# Patient Record
Sex: Male | Born: 1970 | Race: Black or African American | Hispanic: No | Marital: Single | Smoking: Current every day smoker
Health system: Southern US, Community
[De-identification: ages and names within clinical notes are randomized; demographics above are authoritative.]

## PROBLEM LIST (undated history)

## (undated) DIAGNOSIS — J3089 Other allergic rhinitis: Secondary | ICD-10-CM

## (undated) DIAGNOSIS — I10 Essential (primary) hypertension: Secondary | ICD-10-CM

## (undated) HISTORY — PX: LEG SURGERY: SHX1003

---

## 2003-11-26 ENCOUNTER — Emergency Department (HOSPITAL_COMMUNITY): Admission: EM | Admit: 2003-11-26 | Discharge: 2003-11-26 | Payer: Self-pay | Admitting: Family Medicine

## 2003-12-10 ENCOUNTER — Emergency Department (HOSPITAL_COMMUNITY): Admission: EM | Admit: 2003-12-10 | Discharge: 2003-12-10 | Payer: Self-pay | Admitting: Family Medicine

## 2004-09-08 ENCOUNTER — Emergency Department (HOSPITAL_COMMUNITY): Admission: EM | Admit: 2004-09-08 | Discharge: 2004-09-08 | Payer: Self-pay | Admitting: Family Medicine

## 2004-09-12 ENCOUNTER — Emergency Department (HOSPITAL_COMMUNITY): Admission: EM | Admit: 2004-09-12 | Discharge: 2004-09-12 | Payer: Self-pay | Admitting: Family Medicine

## 2005-03-31 ENCOUNTER — Emergency Department (HOSPITAL_COMMUNITY): Admission: EM | Admit: 2005-03-31 | Discharge: 2005-03-31 | Payer: Self-pay | Admitting: Family Medicine

## 2005-04-06 ENCOUNTER — Emergency Department (HOSPITAL_COMMUNITY): Admission: EM | Admit: 2005-04-06 | Discharge: 2005-04-06 | Payer: Self-pay | Admitting: Family Medicine

## 2005-07-07 ENCOUNTER — Emergency Department (HOSPITAL_COMMUNITY): Admission: EM | Admit: 2005-07-07 | Discharge: 2005-07-07 | Payer: Self-pay | Admitting: Family Medicine

## 2007-03-12 ENCOUNTER — Emergency Department (HOSPITAL_COMMUNITY): Admission: EM | Admit: 2007-03-12 | Discharge: 2007-03-12 | Payer: Self-pay | Admitting: Family Medicine

## 2010-04-29 ENCOUNTER — Emergency Department (HOSPITAL_COMMUNITY): Admission: EM | Admit: 2010-04-29 | Discharge: 2010-04-29 | Payer: Self-pay | Admitting: Family Medicine

## 2010-08-17 ENCOUNTER — Encounter: Payer: Self-pay | Admitting: Family Medicine

## 2011-05-08 LAB — POCT URINALYSIS DIP (DEVICE)
Operator id: 282151
Protein, ur: 30 — AB
Urobilinogen, UA: 0.2

## 2011-05-08 LAB — GC/CHLAMYDIA PROBE AMP, GENITAL: GC Probe Amp, Genital: NEGATIVE

## 2012-10-26 ENCOUNTER — Emergency Department (HOSPITAL_COMMUNITY)
Admission: EM | Admit: 2012-10-26 | Discharge: 2012-10-26 | Disposition: A | Discharge status: Home / Self Care | Payer: Self-pay | Attending: Emergency Medicine | Admitting: Emergency Medicine

## 2012-10-26 ENCOUNTER — Encounter (HOSPITAL_COMMUNITY): Payer: Self-pay | Admitting: *Deleted

## 2012-10-26 ENCOUNTER — Emergency Department (HOSPITAL_COMMUNITY): Payer: Self-pay

## 2012-10-26 DIAGNOSIS — Y9389 Activity, other specified: Secondary | ICD-10-CM | POA: Insufficient documentation

## 2012-10-26 DIAGNOSIS — S39012A Strain of muscle, fascia and tendon of lower back, initial encounter: Secondary | ICD-10-CM

## 2012-10-26 DIAGNOSIS — IMO0001 Reserved for inherently not codable concepts without codable children: Secondary | ICD-10-CM | POA: Insufficient documentation

## 2012-10-26 DIAGNOSIS — X500XXA Overexertion from strenuous movement or load, initial encounter: Secondary | ICD-10-CM | POA: Insufficient documentation

## 2012-10-26 DIAGNOSIS — Y929 Unspecified place or not applicable: Secondary | ICD-10-CM | POA: Insufficient documentation

## 2012-10-26 DIAGNOSIS — M62838 Other muscle spasm: Secondary | ICD-10-CM | POA: Insufficient documentation

## 2012-10-26 DIAGNOSIS — R062 Wheezing: Secondary | ICD-10-CM

## 2012-10-26 DIAGNOSIS — F1210 Cannabis abuse, uncomplicated: Secondary | ICD-10-CM | POA: Insufficient documentation

## 2012-10-26 DIAGNOSIS — G8929 Other chronic pain: Secondary | ICD-10-CM | POA: Insufficient documentation

## 2012-10-26 DIAGNOSIS — M545 Low back pain, unspecified: Secondary | ICD-10-CM

## 2012-10-26 DIAGNOSIS — R071 Chest pain on breathing: Secondary | ICD-10-CM | POA: Insufficient documentation

## 2012-10-26 DIAGNOSIS — Z79899 Other long term (current) drug therapy: Secondary | ICD-10-CM | POA: Insufficient documentation

## 2012-10-26 DIAGNOSIS — M62830 Muscle spasm of back: Secondary | ICD-10-CM

## 2012-10-26 DIAGNOSIS — S335XXA Sprain of ligaments of lumbar spine, initial encounter: Secondary | ICD-10-CM | POA: Insufficient documentation

## 2012-10-26 MED ORDER — METHOCARBAMOL 750 MG PO TABS: 750.0000 mg | ORAL_TABLET | Freq: Four times a day (QID) | ORAL | Status: DC | PRN

## 2012-10-26 MED ORDER — DIAZEPAM 5 MG PO TABS
10.0000 mg | ORAL_TABLET | Freq: Once | ORAL | Status: AC
  Administered 2012-10-26: 5 mg via ORAL
  Filled 2012-10-26: qty 2

## 2012-10-26 MED ORDER — NAPROXEN 500 MG PO TABS: 500.0000 mg | ORAL_TABLET | Freq: Two times a day (BID) | ORAL | Status: DC | PRN

## 2012-10-26 MED ORDER — ALBUTEROL SULFATE (5 MG/ML) 0.5% IN NEBU
5.0000 mg | INHALATION_SOLUTION | Freq: Once | RESPIRATORY_TRACT | Status: AC
  Administered 2012-10-26: 5 mg via RESPIRATORY_TRACT
  Filled 2012-10-26: qty 1

## 2012-10-26 MED ORDER — ALBUTEROL SULFATE HFA 108 (90 BASE) MCG/ACT IN AERS: 2.0000 | INHALATION_SPRAY | RESPIRATORY_TRACT | Status: DC | PRN

## 2012-10-26 MED ORDER — HYDROCODONE-ACETAMINOPHEN 5-325 MG PO TABS
2.0000 | ORAL_TABLET | Freq: Once | ORAL | Status: AC
  Administered 2012-10-26: 1 via ORAL
  Filled 2012-10-26: qty 2

## 2012-10-26 MED ORDER — HYDROCODONE-ACETAMINOPHEN 5-325 MG PO TABS: 1.0000 | ORAL_TABLET | Freq: Four times a day (QID) | ORAL | Status: DC | PRN

## 2012-10-26 NOTE — ED Notes (Signed)
Reports severe lower back pain, hx of same, denies new injury to back. Ambulatory at triage.

## 2012-10-26 NOTE — ED Provider Notes (Signed)
Medical screening examination/treatment/procedure(s) were performed by non-physician practitioner and as supervising physician I was immediately available for consultation/collaboration.   Loren Racer, MD 10/26/12 [phone]
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
## 2013-02-27 ENCOUNTER — Encounter (HOSPITAL_COMMUNITY): Payer: Self-pay

## 2013-02-27 ENCOUNTER — Emergency Department (HOSPITAL_COMMUNITY)
Admission: EM | Admit: 2013-02-27 | Discharge: 2013-02-28 | Disposition: A | Discharge status: Home / Self Care | Payer: BC Managed Care – PPO | Attending: Emergency Medicine | Admitting: Emergency Medicine

## 2013-02-27 DIAGNOSIS — M545 Low back pain, unspecified: Secondary | ICD-10-CM | POA: Insufficient documentation

## 2013-02-27 DIAGNOSIS — R109 Unspecified abdominal pain: Secondary | ICD-10-CM | POA: Insufficient documentation

## 2013-02-27 MED ORDER — ONDANSETRON HCL 4 MG/2ML IJ SOLN
4.0000 mg | Freq: Once | INTRAMUSCULAR | Status: AC
  Administered 2013-02-27: 4 mg via INTRAVENOUS
  Filled 2013-02-27: qty 2

## 2013-02-27 MED ORDER — SODIUM CHLORIDE 0.9 % IV SOLN
1000.0000 mL | Freq: Once | INTRAVENOUS | Status: AC
  Administered 2013-02-27: 1000 mL via INTRAVENOUS

## 2013-02-27 MED ORDER — HYDROMORPHONE HCL PF 1 MG/ML IJ SOLN
0.5000 mg | INTRAMUSCULAR | Status: DC | PRN
  Filled 2013-02-27: qty 1

## 2013-02-27 MED ORDER — SODIUM CHLORIDE 0.9 % IV SOLN: 1000.0000 mL | INTRAVENOUS | Status: DC

## 2013-02-27 NOTE — ED Notes (Signed)
Pt c/o constipation and Right side back pain x2 days. Pt reports the pain was relieved after having a BM but the pain returned today. Pt states he has taken Maalox and 2 blue pills his aunt gave him, pt is unsure of the name of the medication.

## 2013-02-27 NOTE — ED Provider Notes (Signed)
CSN: [id number]     Arrival date & time 02/27/13  1922 History     First MD Initiated Contact with Patient 02/27/13 2324     Chief Complaint  Patient presents with  . Constipation  . Back Pain   HPI Patient presents to emergency room with complaints of constipation as well as lower abdominal pain as well as back pain. Symptoms started about 2 days ago. Patient tried taking some laxative medications resulting in a bowel movement. He felt like his symptoms returned yesterday and tried taking laxative medications without any result. The patient denies any nausea or vomiting. He denies any fevers or chills. The pain is in the bilateral lower back as well as the right lower abdomen. He denies any dysuria or hematuria. History reviewed. No pertinent past medical history. Past Surgical History  Procedure Laterality Date  . Leg surgery     History reviewed. No pertinent family history. History  Substance Use Topics  . Smoking status: Not on file  . Smokeless tobacco: Not on file  . Alcohol Use: No    Review of Systems  All other systems reviewed and are negative.    Allergies  Review of patient's allergies indicates no known allergies.  Home Medications   Current Outpatient Rx  Name  Route  Sig  Dispense  Refill  . magnesium citrate SOLN   Oral   Take 296 mLs (1 Bottle total) by mouth once.   195 mL   1    BP 126/78  Pulse 58  Temp(Src) 98.1 F (36.7 C) (Oral)  Resp 20  Ht 5\' 8"  (1.727 m)  Wt 236 lb (107.049 kg)  BMI 35.89 kg/m2  SpO2 100% Physical Exam  Nursing note and vitals reviewed. Constitutional: He appears well-developed and well-nourished. No distress.  HENT:  Head: Normocephalic and atraumatic.  Right Ear: External ear normal.  Left Ear: External ear normal.  Eyes: Conjunctivae are normal. Right eye exhibits no discharge. Left eye exhibits no discharge. No scleral icterus.  Neck: Neck supple. No tracheal deviation present.  Cardiovascular: Normal rate,  regular rhythm and intact distal pulses.   Pulmonary/Chest: Effort normal and breath sounds normal. No stridor. No respiratory distress. He has no wheezes. He has no rales.  Abdominal: Soft. Bowel sounds are normal. He exhibits no distension. There is tenderness in the right lower quadrant. There is no rebound and no guarding. No hernia.  Genitourinary: Rectum normal.  No fecal impaction  Musculoskeletal: He exhibits no edema and no tenderness.  Neurological: He is alert. He has normal strength. No sensory deficit. Cranial nerve deficit:  no gross defecits noted. He exhibits normal muscle tone. He displays no seizure activity. Coordination normal.  Skin: Skin is warm and dry. No rash noted.  Psychiatric: He has a normal mood and affect.    ED Course   Procedures (including critical care time)  Labs Reviewed  COMPREHENSIVE METABOLIC PANEL - Abnormal; Notable for the following:    Total Bilirubin 0.2 (*)    GFR calc non Af Amer 89 (*)    All other components within normal limits  CBC WITH DIFFERENTIAL - Abnormal; Notable for the following:    WBC 12.5 (*)    Lymphs Abs 4.8 (*)    All other components within normal limits  LIPASE, BLOOD  URINALYSIS, ROUTINE W REFLEX MICROSCOPIC   Dg Abd Acute W/chest  02/28/2013   *RADIOLOGY REPORT*  Clinical Data: Constipation Low back pain.  Vomiting.  ACUTE ABDOMEN SERIES (ABDOMEN  2 VIEW & CHEST 1 VIEW)  Comparison: PA and lateral chest 10/26/2012.  Findings: Single view of the chest demonstrates clear lungs and normal heart size.  No pneumothorax or pleural effusion.  Two views of the abdomen show a normal bowel gas pattern.  No free intraperitoneal air is identified.  No abnormal abdominal calcification is seen.  IMPRESSION: Negative exam.   Original Report Authenticated By: Holley Dexter, M.D.   1. Abdominal pain     MDM  Discussed the findings with the patient. He does have an elevated white blood cell count. She does not show any significant  constipation. Explained the patient was concerned about the possibility of appendicitis with his symptoms. I discussed her CT scan of the abdomen rule out that cause.  Patient decided that he did not want that done. He does not think he has appendicitis. Patient would like to try laxative and understands he should return if the symptoms do not improve.  Celene Kras, MD 02/28/13 [phone]

## 2013-02-28 ENCOUNTER — Emergency Department (HOSPITAL_COMMUNITY): Payer: BC Managed Care – PPO

## 2013-02-28 LAB — CBC WITH DIFFERENTIAL/PLATELET
Basophils Relative: 0 % (ref 0–1)
Hemoglobin: 14.7 g/dL (ref 13.0–17.0)
Lymphs Abs: 4.8 10*3/uL — ABNORMAL HIGH (ref 0.7–4.0)
MCHC: 35.7 g/dL (ref 30.0–36.0)
Monocytes Relative: 7 % (ref 3–12)
Neutro Abs: 6.5 10*3/uL (ref 1.7–7.7)
Neutrophils Relative %: 52 % (ref 43–77)
RBC: 4.6 MIL/uL (ref 4.22–5.81)

## 2013-02-28 LAB — COMPREHENSIVE METABOLIC PANEL
Albumin: 4 g/dL (ref 3.5–5.2)
BUN: 9 mg/dL (ref 6–23)
Creatinine, Ser: 1.03 mg/dL (ref 0.50–1.35)
Potassium: 3.7 mEq/L (ref 3.5–5.1)
Total Protein: 7.4 g/dL (ref 6.0–8.3)

## 2013-02-28 LAB — URINALYSIS, ROUTINE W REFLEX MICROSCOPIC
Leukocytes, UA: NEGATIVE
Nitrite: NEGATIVE
Specific Gravity, Urine: 1.014 (ref 1.005–1.030)
pH: 5.5 (ref 5.0–8.0)

## 2013-02-28 LAB — LIPASE, BLOOD: Lipase: 41 U/L (ref 11–59)

## 2013-02-28 MED ORDER — MAGNESIUM CITRATE PO SOLN: 1.0000 | Freq: Once | ORAL | Status: DC

## 2013-02-28 MED ORDER — ACETAMINOPHEN 325 MG PO TABS
650.0000 mg | ORAL_TABLET | Freq: Once | ORAL | Status: AC
  Administered 2013-02-28: 650 mg via ORAL
  Filled 2013-02-28: qty 2

## 2013-02-28 NOTE — ED Notes (Signed)
Patient transported to X-ray 

## 2014-10-26 ENCOUNTER — Encounter (HOSPITAL_COMMUNITY): Payer: Self-pay

## 2014-10-26 ENCOUNTER — Emergency Department (HOSPITAL_COMMUNITY)
Admission: EM | Admit: 2014-10-26 | Discharge: 2014-10-26 | Disposition: A | Discharge status: Home / Self Care | Payer: Self-pay | Attending: Emergency Medicine | Admitting: Emergency Medicine

## 2014-10-26 DIAGNOSIS — R197 Diarrhea, unspecified: Secondary | ICD-10-CM | POA: Insufficient documentation

## 2014-10-26 DIAGNOSIS — R42 Dizziness and giddiness: Secondary | ICD-10-CM | POA: Insufficient documentation

## 2014-10-26 DIAGNOSIS — R112 Nausea with vomiting, unspecified: Secondary | ICD-10-CM | POA: Insufficient documentation

## 2014-10-26 LAB — CBC WITH DIFFERENTIAL/PLATELET
Basophils Absolute: 0 10*3/uL (ref 0.0–0.1)
Basophils Relative: 0 % (ref 0–1)
Eosinophils Absolute: 0.1 10*3/uL (ref 0.0–0.7)
Eosinophils Relative: 1 % (ref 0–5)
HEMATOCRIT: 42.8 % (ref 39.0–52.0)
HEMOGLOBIN: 14.8 g/dL (ref 13.0–17.0)
LYMPHS ABS: 2.9 10*3/uL (ref 0.7–4.0)
Lymphocytes Relative: 24 % (ref 12–46)
MCH: 31.2 pg (ref 26.0–34.0)
MCHC: 34.6 g/dL (ref 30.0–36.0)
MCV: 90.3 fL (ref 78.0–100.0)
MONOS PCT: 7 % (ref 3–12)
Monocytes Absolute: 0.8 10*3/uL (ref 0.1–1.0)
NEUTROS ABS: 8.2 10*3/uL — AB (ref 1.7–7.7)
NEUTROS PCT: 68 % (ref 43–77)
Platelets: 346 10*3/uL (ref 150–400)
RBC: 4.74 MIL/uL (ref 4.22–5.81)
RDW: 12.8 % (ref 11.5–15.5)
WBC: 12 10*3/uL — ABNORMAL HIGH (ref 4.0–10.5)

## 2014-10-26 LAB — COMPREHENSIVE METABOLIC PANEL WITH GFR
ALT: 25 U/L (ref 0–53)
AST: 22 U/L (ref 0–37)
Albumin: 4.1 g/dL (ref 3.5–5.2)
Alkaline Phosphatase: 52 U/L (ref 39–117)
Anion gap: 10 (ref 5–15)
BUN: 10 mg/dL (ref 6–23)
CO2: 25 mmol/L (ref 19–32)
Calcium: 9.3 mg/dL (ref 8.4–10.5)
Chloride: 104 mmol/L (ref 96–112)
Creatinine, Ser: 1.21 mg/dL (ref 0.50–1.35)
GFR calc Af Amer: 84 mL/min — ABNORMAL LOW
GFR calc non Af Amer: 72 mL/min — ABNORMAL LOW
Glucose, Bld: 124 mg/dL — ABNORMAL HIGH (ref 70–99)
Potassium: 4.1 mmol/L (ref 3.5–5.1)
Sodium: 139 mmol/L (ref 135–145)
Total Bilirubin: 0.5 mg/dL (ref 0.3–1.2)
Total Protein: 7.6 g/dL (ref 6.0–8.3)

## 2014-10-26 LAB — URINALYSIS, ROUTINE W REFLEX MICROSCOPIC
Bilirubin Urine: NEGATIVE
Glucose, UA: NEGATIVE mg/dL
Hgb urine dipstick: NEGATIVE
Ketones, ur: NEGATIVE mg/dL
Leukocytes, UA: NEGATIVE
NITRITE: NEGATIVE
Protein, ur: NEGATIVE mg/dL
SPECIFIC GRAVITY, URINE: 1.022 (ref 1.005–1.030)
UROBILINOGEN UA: 0.2 mg/dL (ref 0.0–1.0)
pH: 5 (ref 5.0–8.0)

## 2014-10-26 MED ORDER — SODIUM CHLORIDE 0.9 % IV SOLN: 1000.0000 mL | INTRAVENOUS | Status: DC

## 2014-10-26 MED ORDER — METOCLOPRAMIDE HCL 10 MG PO TABS: 10.0000 mg | ORAL_TABLET | Freq: Four times a day (QID) | ORAL | Status: DC | PRN

## 2014-10-26 MED ORDER — SODIUM CHLORIDE 0.9 % IV SOLN: 1000.0000 mL | Freq: Once | INTRAVENOUS | Status: DC

## 2014-10-26 MED ORDER — ONDANSETRON HCL 4 MG/2ML IJ SOLN
4.0000 mg | Freq: Once | INTRAMUSCULAR | Status: AC
  Administered 2014-10-26: 4 mg via INTRAVENOUS
  Filled 2014-10-26: qty 2

## 2014-10-26 MED ORDER — SODIUM CHLORIDE 0.9 % IV SOLN
1000.0000 mL | Freq: Once | INTRAVENOUS | Status: AC
  Administered 2014-10-26: 1000 mL via INTRAVENOUS

## 2014-10-26 NOTE — ED Notes (Signed)
Pt states he feels fatigued. Pt states he was able to keep down some food and fluids this morning.

## 2014-10-26 NOTE — Discharge Instructions (Signed)
Take loperamide (Imodium AD) as needed for diarrhea.  Nausea and Vomiting Nausea is a sick feeling that often comes before throwing up (vomiting). Vomiting is a reflex where stomach contents come out of your mouth. Vomiting can cause severe loss of body fluids (dehydration). Children and elderly adults can become dehydrated quickly, especially if they also have diarrhea. Nausea and vomiting are symptoms of a condition or disease. It is important to find the cause of your symptoms. CAUSES   Direct irritation of the stomach lining. This irritation can result from increased acid production (gastroesophageal reflux disease), infection, food poisoning, taking certain medicines (such as nonsteroidal anti-inflammatory drugs), alcohol use, or tobacco use.  Signals from the brain.These signals could be caused by a headache, heat exposure, an inner ear disturbance, increased pressure in the brain from injury, infection, a tumor, or a concussion, pain, emotional stimulus, or metabolic problems.  An obstruction in the gastrointestinal tract (bowel obstruction).  Illnesses such as diabetes, hepatitis, gallbladder problems, appendicitis, kidney problems, cancer, sepsis, atypical symptoms of a heart attack, or eating disorders.  Medical treatments such as chemotherapy and radiation.  Receiving medicine that makes you sleep (general anesthetic) during surgery. DIAGNOSIS Your caregiver may ask for tests to be done if the problems do not improve after a few days. Tests may also be done if symptoms are severe or if the reason for the nausea and vomiting is not clear. Tests may include:  Urine tests.  Blood tests.  Stool tests.  Cultures (to look for evidence of infection).  X-rays or other imaging studies. Test results can help your caregiver make decisions about treatment or the need for additional tests. TREATMENT You need to stay well hydrated. Drink frequently but in small amounts.You may wish to  drink water, sports drinks, clear broth, or eat frozen ice pops or gelatin dessert to help stay hydrated.When you eat, eating slowly may help prevent nausea.There are also some antinausea medicines that may help prevent nausea. HOME CARE INSTRUCTIONS   Take all medicine as directed by your caregiver.  If you do not have an appetite, do not force yourself to eat. However, you must continue to drink fluids.  If you have an appetite, eat a normal diet unless your caregiver tells you differently.  Eat a variety of complex carbohydrates (rice, wheat, potatoes, bread), lean meats, yogurt, fruits, and vegetables.  Avoid high-fat foods because they are more difficult to digest.  Drink enough water and fluids to keep your urine clear or pale yellow.  If you are dehydrated, ask your caregiver for specific rehydration instructions. Signs of dehydration may include:  Severe thirst.  Dry lips and mouth.  Dizziness.  Dark urine.  Decreasing urine frequency and amount.  Confusion.  Rapid breathing or pulse. SEEK IMMEDIATE MEDICAL CARE IF:   You have blood or brown flecks (like coffee grounds) in your vomit.  You have black or bloody stools.  You have a severe headache or stiff neck.  You are confused.  You have severe abdominal pain.  You have chest pain or trouble breathing.  You do not urinate at least once every 8 hours.  You develop cold or clammy skin.  You continue to vomit for longer than 24 to 48 hours.  You have a fever. MAKE SURE YOU:   Understand these instructions.  Will watch your condition.  Will get help right away if you are not doing well or get worse. Document Released: 07/13/2005 Document Revised: 10/05/2011 Document Reviewed: 12/10/2010 ExitCare Patient  Information 2015 [place], [place]. This information is not intended to replace advice given to you by your health care provider. Make sure you discuss any questions you have with your health care  provider.  Metoclopramide tablets What is this medicine? METOCLOPRAMIDE (met oh kloe PRA mide) is used to treat the symptoms of gastroesophageal reflux disease (GERD) like heartburn. It is also used to treat people with slow emptying of the stomach and intestinal tract. This medicine may be used for other purposes; ask your health care provider or pharmacist if you have questions. COMMON BRAND NAME(S): Reglan What should I tell my health care provider before I take this medicine? They need to know if you have any of these conditions: -breast cancer -depression -diabetes -heart failure -high blood pressure -kidney disease -liver disease -Parkinson's disease or a movement disorder -pheochromocytoma -seizures -stomach obstruction, bleeding, or perforation -an unusual or allergic reaction to metoclopramide, procainamide, sulfites, other medicines, foods, dyes, or preservatives -pregnant or trying to get pregnant -breast-feeding How should I use this medicine? Take this medicine by mouth with a glass of water. Follow the directions on the prescription label. Take this medicine on an empty stomach, about 30 minutes before eating. Take your doses at regular intervals. Do not take your medicine more often than directed. Do not stop taking except on the advice of your doctor or health care professional. A special MedGuide will be given to you by the pharmacist with each prescription and refill. Be sure to read this information carefully each time. Talk to your pediatrician regarding the use of this medicine in children. Special care may be needed. Overdosage: If you think you have taken too much of this medicine contact a poison control center or emergency room at once. NOTE: This medicine is only for you. Do not share this medicine with others. What if I miss a dose? If you miss a dose, take it as soon as you can. If it is almost time for your next dose, take only that dose. Do not take double  or extra doses. What may interact with this medicine? -acetaminophen -cyclosporine -digoxin -medicines for blood pressure -medicines for diabetes, including insulin -medicines for hay fever and other allergies -medicines for depression, especially an Monoamine Oxidase Inhibitor (MAOI) -medicines for Parkinson's disease, like levodopa -medicines for sleep or for pain -tetracycline This list may not describe all possible interactions. Give your health care provider a list of all the medicines, herbs, non-prescription drugs, or dietary supplements you use. Also tell them if you smoke, drink alcohol, or use illegal drugs. Some items may interact with your medicine. What should I watch for while using this medicine? It may take a few weeks for your stomach condition to start to get better. However, do not take this medicine for longer than 12 weeks. The longer you take this medicine, and the more you take it, the greater your chances are of developing serious side effects. If you are an elderly patient, a male patient, or you have diabetes, you may be at an increased risk for side effects from this medicine. Contact your doctor immediately if you start having movements you cannot control such as lip smacking, rapid movements of the tongue, involuntary or uncontrollable movements of the eyes, head, arms and legs, or muscle twitches and spasms. Patients and their families should watch out for worsening depression or thoughts of suicide. Also watch out for any sudden or severe changes in feelings such as feeling anxious, agitated, panicky, irritable, hostile, aggressive,  impulsive, severely restless, overly excited and hyperactive, or not being able to sleep. If this happens, especially at the beginning of treatment or after a change in dose, call your doctor. Do not treat yourself for high fever. Ask your doctor or health care professional for advice. You may get drowsy or dizzy. Do not drive, use  machinery, or do anything that needs mental alertness until you know how this drug affects you. Do not stand or sit up quickly, especially if you are an older patient. This reduces the risk of dizzy or fainting spells. Alcohol can make you more drowsy and dizzy. Avoid alcoholic drinks. What side effects may I notice from receiving this medicine? Side effects that you should report to your doctor or health care professional as soon as possible: -allergic reactions like skin rash, itching or hives, swelling of the face, lips, or tongue -abnormal production of milk in females -breast enlargement in both males and females -change in the way you walk -difficulty moving, speaking or swallowing -drooling, lip smacking, or rapid movements of the tongue -excessive sweating -fever -involuntary or uncontrollable movements of the eyes, head, arms and legs -irregular heartbeat or palpitations -muscle twitches and spasms -unusually weak or tired Side effects that usually do not require medical attention (report to your doctor or health care professional if they continue or are bothersome): -change in sex drive or performance -depressed mood -diarrhea -difficulty sleeping -headache -menstrual changes -restless or nervous This list may not describe all possible side effects. Call your doctor for medical advice about side effects. You may report side effects to FDA at 1-800-FDA-1088. Where should I keep my medicine? Keep out of the reach of children. Store at room temperature between 20 and 25 degrees C (68 and 77 degrees F). Protect from light. Keep container tightly closed. Throw away any unused medicine after the expiration date. NOTE: This sheet is a summary. It may not cover all possible information. If you have questions about this medicine, talk to your doctor, pharmacist, or health care provider.  2015, [employer]. (2011-11-10 13:04:38)

## 2014-10-26 NOTE — ED Provider Notes (Signed)
CSN: [id number]     Arrival date & time 10/26/14  1251 History   First MD Initiated Contact with Patient 10/26/14 1607     Chief Complaint  Patient presents with  . Nausea     (Consider location/radiation/quality/duration/timing/severity/associated sxs/prior Treatment) The history is provided by the patient.   44 year old male has been sick for the last 3 days with lightheadedness, nausea, vomiting. He started having diarrhea 2 days ago but has not had any diarrhea today. He denies fever, chills, sweats. He continues to have nausea and lightheadedness. He states he has been able to eat some crackers today and did not vomit and he has been tolerating small amounts of fluids today. He denies abdominal pain. There was a sick contact in that he had a child who had a similar illness.  History reviewed. No pertinent past medical history. Past Surgical History  Procedure Laterality Date  . Leg surgery     No family history on file. History  Substance Use Topics  . Smoking status: Never Smoker   . Smokeless tobacco: Not on file  . Alcohol Use: Yes    Review of Systems  All other systems reviewed and are negative.     Allergies  Review of patient's allergies indicates no known allergies.  Home Medications   Prior to Admission medications   Medication Sig Start Date End Date Taking? Authorizing Provider  magnesium citrate SOLN Take 296 mLs (1 Bottle total) by mouth once. 02/28/13   Linwood Dibbles, MD   BP 145/90 mmHg  Pulse 86  Temp(Src) 98.2 F (36.8 C) (Oral)  Resp 16  Ht  (1.727 m)  Wt 250 lb (113.399 kg)  BMI 38.02 kg/m2  SpO2 96% Physical Exam  Nursing note and vitals reviewed.  44 year old male, resting comfortably and in no acute distress. Vital signs are significant for borderline hypertension. Oxygen saturation is 96%, which is normal. Head is normocephalic and atraumatic. PERRLA, EOMI. Oropharynx is clear. Mucous membranes are slightly dry. Neck is nontender and  supple without adenopathy or JVD. Back is nontender and there is no CVA tenderness. Lungs are clear without rales, wheezes, or rhonchi. Chest is nontender. Heart has regular rate and rhythm without murmur. Abdomen is soft, flat, nontender without masses or hepatosplenomegaly and peristalsis is hypoactive. Extremities have no cyanosis or edema, full range of motion is present. Skin is warm and dry without rash. Neurologic: Mental status is normal, cranial nerves are intact, there are no motor or sensory deficits.  ED Course  Procedures (including critical care time) Labs Review Results for orders placed or performed during the hospital encounter of 10/26/14  CBC WITH DIFFERENTIAL  Result Value Ref Range   WBC 12.0 (H) 4.0 - 10.5 K/uL   RBC 4.74 4.22 - 5.81 MIL/uL   Hemoglobin 14.8 13.0 - 17.0 g/dL   HCT 29.5 62.1 - 30.8 %   MCV 90.3 78.0 - 100.0 fL   MCH 31.2 26.0 - 34.0 pg   MCHC 34.6 30.0 - 36.0 g/dL   RDW 65.7 84.6 - 96.2 %   Platelets 346 150 - 400 K/uL   Neutrophils Relative % 68 43 - 77 %   Neutro Abs 8.2 (H) 1.7 - 7.7 K/uL   Lymphocytes Relative 24 12 - 46 %   Lymphs Abs 2.9 0.7 - 4.0 K/uL   Monocytes Relative 7 3 - 12 %   Monocytes Absolute 0.8 0.1 - 1.0 K/uL   Eosinophils Relative 1 0 - 5 %  Eosinophils Absolute 0.1 0.0 - 0.7 K/uL   Basophils Relative 0 0 - 1 %   Basophils Absolute 0.0 0.0 - 0.1 K/uL  Comprehensive metabolic panel  Result Value Ref Range   Sodium 139 135 - 145 mmol/L   Potassium 4.1 3.5 - 5.1 mmol/L   Chloride 104 96 - 112 mmol/L   CO2 25 19 - 32 mmol/L   Glucose, Bld 124 (H) 70 - 99 mg/dL   BUN 10 6 - 23 mg/dL   Creatinine, Ser 1.611.21 0.50 - 1.35 mg/dL   Calcium 9.3 8.4 - 09.610.5 mg/dL   Total Protein 7.6 6.0 - 8.3 g/dL   Albumin 4.1 3.5 - 5.2 g/dL   AST 22 0 - 37 U/L   ALT 25 0 - 53 U/L   Alkaline Phosphatase 52 39 - 117 U/L   Total Bilirubin 0.5 0.3 - 1.2 mg/dL   GFR calc non Af Amer 72 (L) >90 mL/min   GFR calc Af Amer 84 (L) >90 mL/min    Anion gap 10 5 - 15  Urinalysis with microscopic  Result Value Ref Range   Color, Urine YELLOW YELLOW   APPearance CLEAR CLEAR   Specific Gravity, Urine 1.022 1.005 - 1.030   pH 5.0 5.0 - 8.0   Glucose, UA NEGATIVE NEGATIVE mg/dL   Hgb urine dipstick NEGATIVE NEGATIVE   Bilirubin Urine NEGATIVE NEGATIVE   Ketones, ur NEGATIVE NEGATIVE mg/dL   Protein, ur NEGATIVE NEGATIVE mg/dL   Urobilinogen, UA 0.2 0.0 - 1.0 mg/dL   Nitrite NEGATIVE NEGATIVE   Leukocytes, UA NEGATIVE NEGATIVE    MDM   Final diagnoses:  Nausea vomiting and diarrhea    Nausea, vomiting, diarrhea with recent exposure to someone else with similar illness strongly suggestive of viral gastroenteritis. He does not appear toxic but does appear slightly dry. He will be given IV hydration and IV ondansetron.  He feels significant better after above noted treatment. He is discharged with prescription for metoclopramide and is advised to use over-the-counter loperamide if the diarrhea recurs.  Dione Boozeavid Kairah Leoni, MD 10/26/14 [id number]

## 2014-10-26 NOTE — ED Notes (Signed)
Pt is in stable condition upon d/c and ambulates from ED. 

## 2014-10-26 NOTE — ED Notes (Signed)
Pt. Began 4 days ago having n/v/d.  Today he is unable to move his bowels.  Pt. Feels weak.  Pt. Voiding without any difficulty.  Lt. Flank pain when he attempts to move his bowels.  No blood noted

## 2014-12-27 ENCOUNTER — Emergency Department (HOSPITAL_COMMUNITY)
Admission: EM | Admit: 2014-12-27 | Discharge: 2014-12-27 | Disposition: A | Discharge status: Home / Self Care | Payer: Self-pay | Attending: Emergency Medicine | Admitting: Emergency Medicine

## 2014-12-27 ENCOUNTER — Encounter (HOSPITAL_COMMUNITY): Payer: Self-pay

## 2014-12-27 DIAGNOSIS — J039 Acute tonsillitis, unspecified: Secondary | ICD-10-CM | POA: Insufficient documentation

## 2014-12-27 DIAGNOSIS — B349 Viral infection, unspecified: Secondary | ICD-10-CM | POA: Insufficient documentation

## 2014-12-27 LAB — RAPID STREP SCREEN (MED CTR MEBANE ONLY): Streptococcus, Group A Screen (Direct): NEGATIVE

## 2014-12-27 MED ORDER — PENICILLIN V POTASSIUM 500 MG PO TABS: 500.0000 mg | ORAL_TABLET | Freq: Two times a day (BID) | ORAL | Status: AC

## 2014-12-27 NOTE — Discharge Instructions (Signed)
Please call your doctor for a followup appointment within 24-48 hours. When you talk to your doctor please let them know that you were seen in the emergency department and have them acquire all of your records so that they can discuss the findings with you and formulate a treatment plan to fully care for your new and ongoing problems. Please follow-up with health and wellness Center Please take anti-by out excess prescribed Please rest and stay hydrated Please continue to monitor symptoms closely and if symptoms are to worsen or change (fever greater than 101, chills, sweating, nausea, vomiting, chest pain, shortness of breathe, difficulty breathing, weakness, numbness, tingling, worsening or changes to pain pattern, neck swelling, inability to swallow, changes to voice, coughing up blood, tongue swelling, inability swallow, throat closing sensation, tongue swelling) please report back to the Emergency Department immediately.   Tonsillitis Tonsillitis is an infection of the throat that causes the tonsils to become red, tender, and swollen. Tonsils are collections of lymphoid tissue at the back of the throat. Each tonsil has crevices (crypts). Tonsils help fight nose and throat infections and keep infection from spreading to other parts of the body for the first 18 months of life.  CAUSES Sudden (acute) tonsillitis is usually caused by infection with streptococcal bacteria. Long-lasting (chronic) tonsillitis occurs when the crypts of the tonsils become filled with pieces of food and bacteria, which makes it easy for the tonsils to become repeatedly infected. SYMPTOMS  Symptoms of tonsillitis include:  A sore throat, with possible difficulty swallowing.  White patches on the tonsils.  Fever.  Tiredness.  New episodes of snoring during sleep, when you did not snore before.  Small, foul-smelling, yellowish-white pieces of material (tonsilloliths) that you occasionally cough up or spit out. The  tonsilloliths can also cause you to have bad breath. DIAGNOSIS Tonsillitis can be diagnosed through a physical exam. Diagnosis can be confirmed with the results of lab tests, including a throat culture. TREATMENT  The goals of tonsillitis treatment include the reduction of the severity and duration of symptoms and prevention of associated conditions. Symptoms of tonsillitis can be improved with the use of steroids to reduce the swelling. Tonsillitis caused by bacteria can be treated with antibiotic medicines. Usually, treatment with antibiotic medicines is started before the cause of the tonsillitis is known. However, if it is determined that the cause is not bacterial, antibiotic medicines will not treat the tonsillitis. If attacks of tonsillitis are severe and frequent, your health care provider may recommend surgery to remove the tonsils (tonsillectomy). HOME CARE INSTRUCTIONS   Rest as much as possible and get plenty of sleep.  Drink plenty of fluids. While the throat is very sore, eat soft foods or liquids, such as sherbet, soups, or instant breakfast drinks.  Eat frozen ice pops.  Gargle with a warm or cold liquid to help soothe the throat. Mix 1/4 teaspoon of salt and 1/4 teaspoon of baking soda in 8 oz of water. SEEK MEDICAL CARE IF:   Large, tender lumps develop in your neck.  A rash develops.  A green, yellow-brown, or bloody substance is coughed up.  You are unable to swallow liquids or food for 24 hours.  You notice that only one of the tonsils is swollen. SEEK IMMEDIATE MEDICAL CARE IF:   You develop any new symptoms such as vomiting, severe headache, stiff neck, chest pain, or trouble breathing or swallowing.  You have severe throat pain along with drooling or voice changes.  You have severe  pain, unrelieved with recommended medications.  You are unable to fully open the mouth.  You develop redness, swelling, or severe pain anywhere in the neck.  You have a  fever. MAKE SURE YOU:   Understand these instructions.  Will watch your condition.  Will get help right away if you are not doing well or get worse. Document Released: 04/22/2005 Document Revised: 11/27/2013 Document Reviewed: 12/30/2012 [facility] Patient Information 2015 [place], [place]. This information is not intended to replace advice given to you by your health care provider. Make sure you discuss any questions you have with your health care provider.   Emergency Department Resource Guide 1) Find a Doctor and Pay Out of Pocket Although you won't have to find out who is covered by your insurance plan, it is a good idea to ask around and get recommendations. You will then need to call the office and see if the doctor you have chosen will accept you as a new patient and what types of options they offer for patients who are self-pay. Some doctors offer discounts or will set up payment plans for their patients who do not have insurance, but you will need to ask so you aren't surprised when you get to your appointment.  2) Contact Your Local Health Department Not all health departments have doctors that can see patients for sick visits, but many do, so it is worth a call to see if yours does. If you don't know where your local health department is, you can check in your phone book. The CDC also has a tool to help you locate your state's health department, and many state websites also have listings of all of their local health departments.  3) Find a Walk-in Clinic If your illness is not likely to be very severe or complicated, you may want to try a walk in clinic. These are popping up all over the country in pharmacies, drugstores, and shopping centers. They're usually staffed by nurse practitioners or physician assistants that have been trained to treat common illnesses and complaints. They're usually fairly quick and inexpensive. However, if you have serious medical issues or chronic medical  problems, these are probably not your best option.  No Primary Care Doctor: - Call Health Connect at  [phone] - they can help you locate a primary care doctor that  accepts your insurance, provides certain services, etc. - Physician Referral Service- [phone]  Chronic Pain Problems: Organization         Address  Phone   Notes  [facility]  [phone] Patients need to be referred by their primary care doctor.   Medication Assistance: Organization         Address  Phone   Notes  [facility] Medication [facility] [address] [place] [place], [place] [address] [phone] --Must be a resident of [facility] -- Must have NO insurance coverage whatsoever (no Medicaid/ Medicare, etc.) -- The pt. MUST have a primary care doctor that directs their care regularly and follows them in the community   MedAssist  [phone]   [employer]  [phone]    Agencies that provide inexpensive medical care: Organization         Address  Phone   Notes  [facility]  [phone]   [facility]    [phone]   [facility] [address] [place], [place] [address] [phone]   [facility] [address] [place] [phone]   Planned Parenthood    [phone]   [facility]    [phone]   [facility]  [address], [place] Phone:  [phone], Fax:  [phone] Hours of Operation:  9 am - 6 pm, M-F.  Also accepts Medicaid/Medicare and self-pay.  [facility]  [address], [place] Phone: [phone], Fax: [phone]. Hours of Operation:  8:30 am - 5:30 pm, M-F.  Also accepts Medicaid and self-pay.  [facility] [place] [address], [place] Phone: [phone]   [facility] [address] Natasha Bence [place], [place] [phone], Ext. [phone]  Mondays & Thursdays: 7-9 AM.  First 15 patients are seen on a first come, first serve basis.    Medicaid-accepting [facility] Providers:  Organization         Address  Phone   Notes  [facility] [address], [place] [phone] Also accepts self-pay patients.  [facility] [address] Laurell Josephs [place], [place]  [phone]   [facility] [address], [place] [phone]   [facility] [address], [place] [phone]   Renaye Rakers [address], [place]   [phone] Only accepts [place] Access [place] patients after they have their name applied to their card.   Self-Pay (no insurance) in [facility]:  Organization         Address  Phone   Notes  Sickle Cell Patients, [facility] [address] [place], [place] [phone]   [facility] [address] [place], [place] [phone]   [facility] [place]  [address], [place] [phone]   [facility]/Dr. Osei-Bonsu  [address], [place] or [address], [place] [phone] Phone number for both [place] and [place] locations is the same.  [facility]gent Medical and [facility] [address] [place] [phone]   [facility] [place] [facility] [address], [place] or [address] [phone]   [facility] [address], [place] [phone], phone; [phone], fax Sees patients 1st and 3rd Saturday of every month.  Must not qualify for public or private insurance (i.e. Medicaid, Medicare, [place] Health Choice, Veterans' Benefits)  Household income should be no more than 200% of the poverty level The clinic cannot treat you if you are pregnant or think you are pregnant  Sexually transmitted diseases are not treated at  the clinic.    Dental Care: Organization         Address  Phone  Notes  [facility] Department of [facility] [address] [place], [place] [phone] Accepts children up to age 36 who are enrolled in [place] or [place] Health Choice; pregnant women with a Medicaid card; and children who have applied for Medicaid or [place] Health Choice, but were declined, whose parents can pay a reduced fee at time of service.  [facility] Department of [facility]  [address], [place] [phone] Accepts children up to age 69 who are enrolled in [place] or  Health Choice; pregnant women with a Medicaid card; and children who  have applied for Medicaid or [place] Health Choice, but were declined, whose parents can pay a reduced fee at time of service.  [facility]  [address], [place] [phone] Patients are seen by appointment only. Walk-ins are not accepted. [facility] will see patients 43 years of age and older. Monday - Tuesday (8am-5pm) Most Wednesdays (8:30-5pm) $30 per visit, cash only  [facility]  [address], [facility] [place] [phone] Patients are seen by appointment only. Walk-ins are not accepted. [facility] will see patients 43 years of age and older. One Wednesday Evening (Monthly: Volunteer Based).  $30 per visit, cash only  [employer] [facility]  [phone] for adults; Children under age 814, call Graduate Pediatric Dentistry at [phone]. Children aged 24-14, please call [phone] to request a pediatric application.  Dental services are provided in all areas of dental care including fillings, crowns and bridges, complete and partial dentures, implants, gum treatment, root canals, and extractions. Preventive care is also provided. Treatment is provided to both adults and children. Patients are selected via a lottery and there is often a  waiting list.   [facility] [address], [place]  [phone] [email or web address]   [facility] [address], [place], [place] [phone], Ext. [phone] Second and Fourth Thursday of each month, opens at 6:30 AM; Clinic ends at 9 AM.  Patients are seen on a first-come first-served basis, and a limited number are seen during each clinic.   [facility]  [address], [place], [place] [phone]   Eligibility Requirements You must have lived in [place], [place], or [place] counties for at least the last three months.   You cannot be eligible for state or federal sponsored [place]healthcare insurance, including CIGNAVeterans Administration, [place]icaid, or [employer]icare.   You generally cannot be eligible for healthcare insurance through your employer.    How to apply: Eligibility screenings are held every Tuesday and Wednesday afternoon from 1:00 pm until 4:00 pm. You do not need an appointment for the interview!  [facility] [address], [place], [place] [phone]   [facility]  [phone]   [facility]  [phone]   [facility]  [phone]    Behavioral Health Resources in the Community: Intensive Outpatient Programs Organization         Address  Phone  Notes  [facility] [address], [place], [place] [phone]   [facility] [address], [place], [place] [phone]   [facility] [address], [place], [place]  [phone]   [facility] [address],  [place], [place] [phone] or [phone]   Substance Abuse Resources Organization         Address  Phone  Notes  [facility]  [phone]   [facility]  [phone]   [facility]  [phone]   [facility]  [phone]   Residential & Outpatient Substance Abuse  Program  [phone]   Psychological Services Organization         Address  Phone  Notes  [facility]  [phone]   [facility]  [phone]- [phone]   [facility] [address], [place] [phone] or [phone]    Mobile Crisis Teams Organization  Address  Phone  Notes  [facility], Mobile Crisis Care Unit  [phone]   [facility]  [address] [place], [place]   [facility] [address] [place] [phone]    Self-Help/Support Groups Organization         Address  Phone             Notes  [place] [place] - variety of support groups  [facility] Call for more information  Narcotics Anonymous (NA), [facility] [address], [employer] [place]  2 meetings at this location   Special educational needs teacher         Address  Phone  Notes  [facility]idential [facility] [place],    [place]  [phone]   [facility]  [address] [place] 034742, [place], [place]   [place], [place] [phone] Admissions: 8am-3pm M-F  Incentives Substance [place] [address],    [place], [place] [phone]   [facility] [address] [place], [place], [place]   [facility] [address]  [place], [place]   [facility] - Intensive Outpatient [address] Kristeen [address], [place], [place]   [facility] [place].,  [place], [place] [phone] or [phone]   [facility] ([facility]) [address] [place], [place] Accepts Medicaid  [facility] [place] [address]  [place] [phone] Substance Abuse/Addiction Treatment   [facility] Organization          Address  Phone  Notes  [facility]  [phone]   Domenic Schwab, PhD [address] Arlis Porta [place], [place]   [phone] or [phone]   [place], [place] [phone]   [facility] [address] [place], [place] [phone] Insurance/Medicaid/sponsorship through [facility] and Families [address], [address] [place]                                    [place], [place] [phone] [place] [address], [place] [phone]    Dr. Adele Schilder  [phone]   [facility] Dept. 1) [address] [place] 2) [place] 3)  [place] [address], [place] [phone]  [phone]   [place] [phone] or [phone] (After Hours)

## 2014-12-27 NOTE — ED Notes (Signed)
Pt c/o sore throat x2 days. Pt took Chloraseptic with no relief. Denies shortness of breath/nasal congestion/cough.

## 2014-12-27 NOTE — ED Provider Notes (Signed)
CSN: [id number]     Arrival date & time 12/27/14  9147 History   First MD Initiated Contact with Patient 12/27/14 [phone]     Chief Complaint  Patient presents with  . Sore Throat     (Consider location/radiation/quality/duration/timing/severity/associated sxs/prior Treatment) The history is provided by the patient. No language interpreter was used.  Jordan Ewing is a 44 year old male with past medical history of leg surgery presenting to the ED with sore throat that started approximately 2 days ago. Patient reports that the pain is worse with swallowing described as knife sticking sensation. Reported that he's been using Chloraseptic spray without relief. 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 Reported that he has been able to swallow without difficulty, chest pain. Denied cough, nasal congestion, fever, chills, neck stiffness, neck pain, nausea, vomiting, travels, blurred vision, sudden loss of vision, headache, dizziness, voice changes, muffled voice, hemoptysis, chest pain, shortness of breath, difficulty breathing, tongue swelling, throat closing sensation. PCP none  History reviewed. No pertinent past medical history. Past Surgical History  Procedure Laterality Date  . Leg surgery     History reviewed. No pertinent family history. History  Substance Use Topics  . Smoking status: Never Smoker   . Smokeless tobacco: Not on file  . Alcohol Use: Yes    Review of Systems  Constitutional: Negative for fever and chills.  HENT: Positive for sore throat. Negative for ear pain, sinus pressure and trouble swallowing.   Respiratory: Negative for chest tightness and shortness of breath.   Cardiovascular: Negative for chest pain.  Gastrointestinal: Negative for nausea, vomiting and abdominal pain.  Genitourinary: Negative for dysuria and discharge.  Musculoskeletal: Negative for back pain, neck pain and neck stiffness.  Neurological: Negative for dizziness, weakness  and headaches.      Allergies  Review of patient's allergies indicates no known allergies.  Home Medications   Prior to Admission medications   Medication Sig Start Date End Date Taking? Authorizing Provider  magnesium citrate SOLN Take 296 mLs (1 Bottle total) by mouth once. Patient not taking: Reported on 10/26/2014 02/28/13   Linwood Dibbles, MD  metoCLOPramide (REGLAN) 10 MG tablet Take 1 tablet (10 mg total) by mouth every 6 (six) hours as needed for nausea. 10/26/14   Dione Booze, MD  penicillin v potassium (VEETID) 500 MG tablet Take 1 tablet (500 mg total) by mouth 2 (two) times daily. 12/27/14 01/03/15  Patricio Popwell, PA-C   BP 141/73 mmHg  Pulse 101  Temp(Src) 98.8 F (37.1 C) (Oral)  Resp 16  SpO2 97% Physical Exam  Constitutional: He is oriented to person, place, and time. He appears well-developed and well-nourished. No distress.  HENT:  Head: Normocephalic and atraumatic.  Negative facial swelling Negative tongue swelling  Uvula midline with symmetrical elevation. Negative uvula swelling. Negative trismus. Negative petechiae identified to the soft palate. Mild erythema identified to the tonsils and the posterior oropharynx. Mild tonsillar adenopathy identified without findings of exudate or ulcerations. Negative bleeding noted. Negative sublingual lesions.  Eyes: Conjunctivae and EOM are normal. Pupils are equal, round, and reactive to light. Right eye exhibits no discharge. Left eye exhibits no discharge.  Neck: Normal range of motion. Neck supple.  Negative neck stiffness Negative nuchal rigidity Negative meningeal signs  Cardiovascular: Normal rate, regular rhythm and normal heart sounds.   Pulmonary/Chest: Effort normal and breath sounds normal. No respiratory distress. He has no wheezes. He has no rales.  Patient is able to speak in  full sentences without difficulty Negative use of accessory muscles Negative stridor  Musculoskeletal: Normal range of motion.   Neurological: He is alert and oriented to person, place, and time. No cranial nerve deficit. He exhibits normal muscle tone. Coordination normal.  Skin: Skin is warm and dry. No rash noted. He is not diaphoretic. No erythema.  Psychiatric: He has a normal mood and affect. His behavior is normal. Thought content normal.  Nursing note and vitals reviewed.   ED Course  Procedures (including critical care time)  Results for orders placed or performed during the hospital encounter of 12/27/14  Rapid strep screen   (If patient has fever and/or without cough or runny nose)  Result Value Ref Range   Streptococcus, Group A Screen (Direct) NEGATIVE NEGATIVE    Labs Review Labs Reviewed  RAPID STREP SCREEN ([facility])  CULTURE, GROUP A STREP    Imaging Review No results found.   EKG Interpretation None      MDM   Final diagnoses:  Tonsillitis  Viral syndrome    Medications - No data to display  Filed Vitals:   12/27/14 0707 12/27/14 0730 12/27/14 0830 12/27/14 0845  BP: 133/84 137/87 138/88 141/73  Pulse: 108 100 95 101  Temp: 98.8 F (37.1 C)     TempSrc: Oral     Resp: 16 16 16 16   SpO2: 96% 94% 94% 97%   Doubt peritonsillar abscess. Doubt retropharyngeal abscess. Doubt Ludwig's angina. Suspicion to be beginnings of tonsillitis. Patient stable, afebrile. Patient not septic appearing. Negative signs of respiratory distress. Airway intact with negative signs of obstruction-patient speaks clearly without issue or use of accessory muscles. Discharged patient. Discharge patient with anthracotic. Discussed with patient to rest and stay hydrated. Discussed with patient to [facility]. Discussed with patient to closely monitor symptoms and if symptoms are to worsen or change to report back to the ED - strict return instructions given.  Patient agreed to plan of care, understood, all questions answered.   Raymon MuttonMarissa Keary Waterson, PA-C 12/27/14  0901  Jerelyn ScottMartha Linker, MD 12/27/14 [phone]

## 2014-12-29 LAB — CULTURE, GROUP A STREP

## 2015-05-28 ENCOUNTER — Encounter (HOSPITAL_COMMUNITY): Payer: Self-pay | Admitting: *Deleted

## 2015-05-28 ENCOUNTER — Emergency Department (HOSPITAL_COMMUNITY)
Admission: EM | Admit: 2015-05-28 | Discharge: 2015-05-28 | Disposition: A | Discharge status: Home / Self Care | Payer: Self-pay | Attending: Emergency Medicine | Admitting: Emergency Medicine

## 2015-05-28 ENCOUNTER — Emergency Department (HOSPITAL_COMMUNITY): Payer: Self-pay

## 2015-05-28 DIAGNOSIS — Y9389 Activity, other specified: Secondary | ICD-10-CM | POA: Insufficient documentation

## 2015-05-28 DIAGNOSIS — S80812A Abrasion, left lower leg, initial encounter: Secondary | ICD-10-CM | POA: Insufficient documentation

## 2015-05-28 DIAGNOSIS — S0101XA Laceration without foreign body of scalp, initial encounter: Secondary | ICD-10-CM | POA: Insufficient documentation

## 2015-05-28 DIAGNOSIS — W228XXA Striking against or struck by other objects, initial encounter: Secondary | ICD-10-CM | POA: Insufficient documentation

## 2015-05-28 DIAGNOSIS — S40811A Abrasion of right upper arm, initial encounter: Secondary | ICD-10-CM | POA: Insufficient documentation

## 2015-05-28 DIAGNOSIS — Y9289 Other specified places as the place of occurrence of the external cause: Secondary | ICD-10-CM | POA: Insufficient documentation

## 2015-05-28 DIAGNOSIS — S40812A Abrasion of left upper arm, initial encounter: Secondary | ICD-10-CM | POA: Insufficient documentation

## 2015-05-28 DIAGNOSIS — S80811A Abrasion, right lower leg, initial encounter: Secondary | ICD-10-CM | POA: Insufficient documentation

## 2015-05-28 DIAGNOSIS — Y998 Other external cause status: Secondary | ICD-10-CM | POA: Insufficient documentation

## 2015-05-28 DIAGNOSIS — J039 Acute tonsillitis, unspecified: Secondary | ICD-10-CM

## 2015-05-28 DIAGNOSIS — J029 Acute pharyngitis, unspecified: Secondary | ICD-10-CM | POA: Insufficient documentation

## 2015-05-28 DIAGNOSIS — S0990XA Unspecified injury of head, initial encounter: Secondary | ICD-10-CM | POA: Insufficient documentation

## 2015-05-28 MED ORDER — AMOXICILLIN 500 MG PO CAPS: 500.0000 mg | ORAL_CAPSULE | Freq: Three times a day (TID) | ORAL | Status: DC

## 2015-05-28 NOTE — ED Notes (Signed)
Spoke to Dr Juleen ChinaKohut regarding pt, CT head ordered. Pt updated.

## 2015-05-28 NOTE — Discharge Instructions (Signed)
Tonsillitis Tonsillitis is an infection of the throat. This infection causes the tonsils to become red, tender, and puffy (swollen). Tonsils are groups of tissue at the back of your throat. If bacteria caused your infection, antibiotic medicine will be given to you. Sometimes symptoms of tonsillitis can be relieved with the use of steroid medicine. If your tonsillitis is severe and happens often, you may need to get your tonsils removed (tonsillectomy). HOME CARE   Rest and sleep often.  Drink enough fluids to keep your pee (urine) clear or pale yellow.  While your throat is sore, eat soft or liquid foods like:  Soup.  Ice cream.  Instant breakfast drinks.  Eat frozen ice pops.  Gargle with a warm or cold liquid to help soothe the throat. Gargle with a water and salt mix. Mix 1/4 teaspoon of salt and 1/4 teaspoon of baking soda in 1 cup of water.  Only take medicines as told by your doctor.  If you are given medicines (antibiotics), take them as told. Finish them even if you start to feel better. GET HELP IF:  You have large, tender lumps in your neck.  You have a rash.  You cough up green, yellow-brown, or bloody fluid.  You cannot swallow liquids or food for 24 hours.  You notice that only one of your tonsils is swollen. GET HELP RIGHT AWAY IF:   You throw up (vomit).  You have a very bad headache.  You have a stiff neck.  You have chest pain.  You have trouble breathing or swallowing.  You have bad throat pain, drooling, or your voice changes.  You have bad pain not helped by medicine.  You cannot fully open your mouth.  You have redness, puffiness, or bad pain in the neck.  You have a fever. MAKE SURE YOU:   Understand these instructions.  Will watch your condition.  Will get help right away if you are not doing well or get worse.   This information is not intended to replace advice given to you by your health care provider. Make sure you discuss any  questions you have with your health care provider.   Document Released: 12/30/2007 Document Revised: 07/18/2013 Document Reviewed: 12/30/2012 Elsevier Interactive Patient Education 2016 Elsevier Inc. Post-Concussion Syndrome Post-concussion syndrome describes the symptoms that can occur after a head injury. These symptoms can last from weeks to months. CAUSES  It is not clear why some head injuries cause post-concussion syndrome. It can occur whether your head injury was mild or severe and whether you were wearing head protection or not.  SIGNS AND SYMPTOMS  Memory difficulties.  Dizziness.  Headaches.  Double vision or blurry vision.  Sensitivity to light.  Hearing difficulties.  Depression.  Tiredness.  Weakness.  Difficulty with concentration.  Difficulty sleeping or staying asleep.  Vomiting.  Poor balance or instability on your feet.  Slow reaction time.  Difficulty learning and remembering things you have heard. DIAGNOSIS  There is no test to determine whether you have post-concussion syndrome. Your health care provider may order an imaging scan of your brain, such as a CT scan, to check for other problems that may be causing your symptoms (such as a severe injury inside your skull). TREATMENT  Usually, these problems disappear over time without medical care. Your health care provider may prescribe medicine to help ease your symptoms. It is important to follow up with a neurologist to evaluate your recovery and address any lingering symptoms or issues. HOME  CARE INSTRUCTIONS   Take medicines only as directed by your health care provider. Do not take aspirin. Aspirin can slow blood clotting.  Sleep with your head slightly elevated to help with headaches.  Avoid any situation where there is potential for another head injury. This includes football, hockey, soccer, basketball, martial arts, downhill snow sports, and horseback riding. Your condition will get worse  every time you experience a concussion. You should avoid these activities until you are evaluated by the appropriate follow-up health care providers.  Keep all follow-up visits as directed by your health care provider. This is important. SEEK MEDICAL CARE IF:  You have increased problems paying attention or concentrating.  You have increased difficulty remembering or learning new information.  You need more time to complete tasks or assignments than before.  You have increased irritability or decreased ability to cope with stress.  You have more symptoms than before. Seek medical care if you have any of the following symptoms for more than two weeks after your injury:  Lasting (chronic) headaches.  Dizziness or balance problems.  Nausea.  Vision problems.  Increased sensitivity to noise or light.  Depression or mood swings.  Anxiety or irritability.  Memory problems.  Difficulty concentrating or paying attention.  Sleep problems.  Feeling tired all the time. SEEK IMMEDIATE MEDICAL CARE IF:  You have confusion or unusual drowsiness.  Others find it difficult to wake you up.  You have nausea or persistent, forceful vomiting.  You feel like you are moving when you are not (vertigo). Your eyes may move rapidly back and forth.  You have convulsions or faint.  You have severe, persistent headaches that are not relieved by medicine.  You cannot use your arms or legs normally.  One of your pupils is larger than the other.  You have clear or bloody discharge from your nose or ears.  Your problems are getting worse, not better. MAKE SURE YOU:  Understand these instructions.  Will watch your condition.  Will get help right away if you are not doing well or get worse.   This information is not intended to replace advice given to you by your health care provider. Make sure you discuss any questions you have with your health care provider.   Document Released:  01/02/2002 Document Revised: 08/03/2014 Document Reviewed: 10/18/2013 Elsevier Interactive Patient Education 2016 Elsevier Inc. Laceration Care, Adult A laceration is a cut that goes through all of the layers of the skin and into the tissue that is right under the skin. Some lacerations heal on their own. Others need to be closed with stitches (sutures), staples, skin adhesive strips, or skin glue. Proper laceration care minimizes the risk of infection and helps the laceration to heal better. HOW TO CARE FOR YOUR LACERATION If sutures or staples were used:  Keep the wound clean and dry.  If you were given a bandage (dressing), you should change it at least one time per day or as told by your health care provider. You should also change it if it becomes wet or dirty.  Keep the wound completely dry for the first 24 hours or as told by your health care provider. After that time, you may shower or bathe. However, make sure that the wound is not soaked in water until after the sutures or staples have been removed.  Clean the wound one time each day or as told by your health care provider:  Wash the wound with soap and water.  Rinse  the wound with water to remove all soap.  Pat the wound dry with a clean towel. Do not rub the wound.  After cleaning the wound, apply a thin layer of antibiotic ointmentas told by your health care provider. This will help to prevent infection and keep the dressing from sticking to the wound.  Have the sutures or staples removed as told by your health care provider. If skin adhesive strips were used:  Keep the wound clean and dry.  If you were given a bandage (dressing), you should change it at least one time per day or as told by your health care provider. You should also change it if it becomes dirty or wet.  Do not get the skin adhesive strips wet. You may shower or bathe, but be careful to keep the wound dry.  If the wound gets wet, pat it dry with a clean  towel. Do not rub the wound.  Skin adhesive strips fall off on their own. You may trim the strips as the wound heals. Do not remove skin adhesive strips that are still stuck to the wound. They will fall off in time. If skin glue was used:  Try to keep the wound dry, but you may briefly wet it in the shower or bath. Do not soak the wound in water, such as by swimming.  After you have showered or bathed, gently pat the wound dry with a clean towel. Do not rub the wound.  Do not do any activities that will make you sweat heavily until the skin glue has fallen off on its own.  Do not apply liquid, cream, or ointment medicine to the wound while the skin glue is in place. Using those may loosen the film before the wound has healed.  If you were given a bandage (dressing), you should change it at least one time per day or as told by your health care provider. You should also change it if it becomes dirty or wet.  If a dressing is placed over the wound, be careful not to apply tape directly over the skin glue. Doing that may cause the glue to be pulled off before the wound has healed.  Do not pick at the glue. The skin glue usually remains in place for 5-10 days, then it falls off of the skin. General Instructions  Take over-the-counter and prescription medicines only as told by your health care provider.  If you were prescribed an antibiotic medicine or ointment, take or apply it as told by your doctor. Do not stop using it even if your condition improves.  To help prevent scarring, make sure to cover your wound with sunscreen whenever you are outside after stitches are removed, after adhesive strips are removed, or when glue remains in place and the wound is healed. Make sure to wear a sunscreen of at least 30 SPF.  Do not scratch or pick at the wound.  Keep all follow-up visits as told by your health care provider. This is important.  Check your wound every day for signs of infection. Watch  for:  Redness, swelling, or pain.  Fluid, blood, or pus.  Raise (elevate) the injured area above the level of your heart while you are sitting or lying down, if possible. SEEK MEDICAL CARE IF:  You received a tetanus shot and you have swelling, severe pain, redness, or bleeding at the injection site.  You have a fever.  A wound that was closed breaks open.  You notice  a bad smell coming from your wound or your dressing.  You notice something coming out of the wound, such as wood or glass.  Your pain is not controlled with medicine.  You have increased redness, swelling, or pain at the site of your wound.  You have fluid, blood, or pus coming from your wound.  You notice a change in the color of your skin near your wound.  You need to change the dressing frequently due to fluid, blood, or pus draining from the wound.  You develop a new rash.  You develop numbness around the wound. SEEK IMMEDIATE MEDICAL CARE IF:  You develop severe swelling around the wound.  Your pain suddenly increases and is severe.  You develop painful lumps near the wound or on skin that is anywhere on your body.  You have a red streak going away from your wound.  The wound is on your hand or foot and you cannot properly move a finger or toe.  The wound is on your hand or foot and you notice that your fingers or toes look pale or bluish.   This information is not intended to replace advice given to you by your health care provider. Make sure you discuss any questions you have with your health care provider.   Document Released: 07/13/2005 Document Revised: 11/27/2014 Document Reviewed: 07/09/2014 Elsevier Interactive Patient Education [employer]
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
## 2015-05-30 DIAGNOSIS — H109 Unspecified conjunctivitis: Secondary | ICD-10-CM | POA: Insufficient documentation

## 2015-05-30 DIAGNOSIS — L089 Local infection of the skin and subcutaneous tissue, unspecified: Secondary | ICD-10-CM | POA: Insufficient documentation

## 2015-05-30 DIAGNOSIS — W228XXD Striking against or struck by other objects, subsequent encounter: Secondary | ICD-10-CM | POA: Insufficient documentation

## 2015-05-30 DIAGNOSIS — Z792 Long term (current) use of antibiotics: Secondary | ICD-10-CM | POA: Insufficient documentation

## 2015-05-30 DIAGNOSIS — S0101XD Laceration without foreign body of scalp, subsequent encounter: Secondary | ICD-10-CM | POA: Insufficient documentation

## 2015-05-31 ENCOUNTER — Encounter (HOSPITAL_COMMUNITY): Payer: Self-pay

## 2015-05-31 ENCOUNTER — Emergency Department (HOSPITAL_COMMUNITY)
Admission: EM | Admit: 2015-05-31 | Discharge: 2015-05-31 | Disposition: A | Discharge status: Home / Self Care | Payer: Self-pay | Attending: Emergency Medicine | Admitting: Emergency Medicine

## 2015-05-31 DIAGNOSIS — L089 Local infection of the skin and subcutaneous tissue, unspecified: Secondary | ICD-10-CM

## 2015-05-31 DIAGNOSIS — T148XXA Other injury of unspecified body region, initial encounter: Secondary | ICD-10-CM

## 2015-05-31 DIAGNOSIS — H109 Unspecified conjunctivitis: Secondary | ICD-10-CM

## 2015-05-31 MED ORDER — TRAMADOL HCL 50 MG PO TABS: 50.0000 mg | ORAL_TABLET | Freq: Four times a day (QID) | ORAL | Status: DC | PRN

## 2015-05-31 MED ORDER — POLYMYXIN B-TRIMETHOPRIM 10000-0.1 UNIT/ML-% OP SOLN: 1.0000 [drp] | OPHTHALMIC | Status: DC

## 2015-05-31 MED ORDER — SULFAMETHOXAZOLE-TRIMETHOPRIM 800-160 MG PO TABS: 1.0000 | ORAL_TABLET | Freq: Two times a day (BID) | ORAL | Status: AC

## 2015-05-31 MED ORDER — HYDROCODONE-ACETAMINOPHEN 5-325 MG PO TABS
1.0000 | ORAL_TABLET | Freq: Once | ORAL | Status: AC
  Administered 2015-05-31: 1 via ORAL
  Filled 2015-05-31: qty 1
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
## 2015-05-31 NOTE — ED Notes (Signed)
Patient left at this time with all belongings. 

## 2015-05-31 NOTE — Discharge Instructions (Signed)
Cellulitis °Cellulitis is an infection of the skin and the tissue beneath it. The infected area is usually red and tender. Cellulitis occurs most often in the arms and lower legs.  °CAUSES  °Cellulitis is caused by bacteria that enter the skin through cracks or cuts in the skin. The most common types of bacteria that cause cellulitis are staphylococci and streptococci. °SIGNS AND SYMPTOMS  °· Redness and warmth. °· Swelling. °· Tenderness or pain. °· Fever. °DIAGNOSIS  °Your health care provider can usually determine what is wrong based on a physical exam. Blood tests may also be done. °TREATMENT  °Treatment usually involves taking an antibiotic medicine. °HOME CARE INSTRUCTIONS  °· Take your antibiotic medicine as directed by your health care provider. Finish the antibiotic even if you start to feel better. °· Keep the infected arm or leg elevated to reduce swelling. °· Apply a warm cloth to the affected area up to 4 times per day to relieve pain. °· Take medicines only as directed by your health care provider. °· Keep all follow-up visits as directed by your health care provider. °SEEK MEDICAL CARE IF:  °· You notice red streaks coming from the infected area. °· Your red area gets larger or turns dark in color. °· Your bone or joint underneath the infected area becomes painful after the skin has healed. °· Your infection returns in the same area or another area. °· You notice a swollen bump in the infected area. °· You develop new symptoms. °· You have a fever. °SEEK IMMEDIATE MEDICAL CARE IF:  °· You feel very sleepy. °· You develop vomiting or diarrhea. °· You have a general ill feeling (malaise) with muscle aches and pains. °  °This information is not intended to replace advice given to you by your health care provider. Make sure you discuss any questions you have with your health care provider. °  °Document Released: 04/22/2005 Document Revised: 04/03/2015 Document Reviewed: 09/28/2011 °Elsevier Interactive  Patient Education ©2016 [employer] °Bacterial Conjunctivitis °Bacterial conjunctivitis, commonly called pink eye, is an inflammation of the clear membrane that covers the white part of the eye (conjunctiva). The inflammation can also happen on the underside of the eyelids. The blood vessels in the conjunctiva become inflamed, causing the eye to become red or pink. Bacterial conjunctivitis may spread easily from one eye to another and from person to person (contagious).  °CAUSES  °Bacterial conjunctivitis is caused by bacteria. The bacteria may come from your own skin, your upper respiratory tract, or from someone else with bacterial conjunctivitis. °SYMPTOMS  °The normally white color of the eye or the underside of the eyelid is usually pink or red. The pink eye is usually associated with irritation, tearing, and some sensitivity to light. Bacterial conjunctivitis is often associated with a thick, yellowish discharge from the eye. The discharge may turn into a crust on the eyelids overnight, which causes your eyelids to stick together. If a discharge is present, there may also be some blurred vision in the affected eye. °DIAGNOSIS  °Bacterial conjunctivitis is diagnosed by your caregiver through an eye exam and the symptoms that you report. Your caregiver looks for changes in the surface tissues of your eyes, which may point to the specific type of conjunctivitis. A sample of any discharge may be collected on a cotton-tip swab if you have a severe case of conjunctivitis, if your cornea is affected, or if you keep getting repeat infections that do not respond to treatment. The sample will be   sent to a lab to see if the inflammation is caused by a bacterial infection and to see if the infection will respond to antibiotic medicines. °TREATMENT  °· Bacterial conjunctivitis is treated with antibiotics. Antibiotic eyedrops are most often used. However, antibiotic ointments are also available. Antibiotics pills are  sometimes used. Artificial tears or eye washes may ease discomfort. °HOME CARE INSTRUCTIONS  °· To ease discomfort, apply a cool, clean washcloth to your eye for 10-20 minutes, 3-4 times a day. °· Gently wipe away any drainage from your eye with a warm, wet washcloth or a cotton ball. °· Wash your hands often with soap and water. Use paper towels to dry your hands. °· Do not share towels or washcloths. This may spread the infection. °· Change or wash your pillowcase every day. °· You should not use eye makeup until the infection is gone. °· Do not operate machinery or drive if your vision is blurred. °· Stop using contact lenses. Ask your caregiver how to sterilize or replace your contacts before using them again. This depends on the type of contact lenses that you use. °· When applying medicine to the infected eye, do not touch the edge of your eyelid with the eyedrop bottle or ointment tube. °SEEK IMMEDIATE MEDICAL CARE IF:  °· Your infection has not improved within 3 days after beginning treatment. °· You had yellow discharge from your eye and it returns. °· You have increased eye pain. °· Your eye redness is spreading. °· Your vision becomes blurred. °· You have a fever or persistent symptoms for more than 2-3 days. °· You have a fever and your symptoms suddenly get worse. °· You have facial pain, redness, or swelling. °MAKE SURE YOU:  °· Understand these instructions. °· Will watch your condition. °· Will get help right away if you are not doing well or get worse. °  °This information is not intended to replace advice given to you by your health care provider. Make sure you discuss any questions you have with your health care provider. °  °Document Released: 07/13/2005 Document Revised: 08/03/2014 Document Reviewed: 12/14/2011 °Elsevier Interactive Patient Education ©2016 [employer] ° °
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
## 2015-05-31 NOTE — ED Notes (Signed)
MD at bedside. 

## 2015-06-04 ENCOUNTER — Encounter (HOSPITAL_COMMUNITY): Payer: Self-pay | Admitting: Emergency Medicine

## 2015-06-04 ENCOUNTER — Emergency Department (HOSPITAL_COMMUNITY)
Admission: EM | Admit: 2015-06-04 | Discharge: 2015-06-04 | Disposition: A | Discharge status: Home / Self Care | Payer: Self-pay | Attending: Emergency Medicine | Admitting: Emergency Medicine

## 2015-06-04 DIAGNOSIS — Z792 Long term (current) use of antibiotics: Secondary | ICD-10-CM | POA: Insufficient documentation

## 2015-06-04 DIAGNOSIS — K5901 Slow transit constipation: Secondary | ICD-10-CM | POA: Insufficient documentation

## 2015-06-04 MED ORDER — MAGNESIUM CITRATE PO SOLN
0.5000 | Freq: Once | ORAL | Status: AC
  Administered 2015-06-04: 0.5 via ORAL
  Filled 2015-06-04: qty 296

## 2015-06-04 NOTE — ED Provider Notes (Signed)
CSN: [id number]     Arrival date & time 06/04/15  4401 History   First MD Initiated Contact with Patient 06/04/15 1015     Chief Complaint  Patient presents with  . Abdominal Pain     (Consider location/radiation/quality/duration/timing/severity/associated sxs/prior Treatment) HPI    Jordan Ewing is a 44 y.o. male  who presents for evaluation of no bowel movement today, and lower back pain. He thinks that he might be constipated. He states that he was given medications, for infection on his head. The infection has improved. He denies problems with arm infection at this time. He has previously had constipation. There are no other known modifying factors.    History reviewed. No pertinent past medical history. Past Surgical History  Procedure Laterality Date  . Leg surgery     No family history on file. Social History  Substance Use Topics  . Smoking status: Never Smoker   . Smokeless tobacco: None  . Alcohol Use: Yes    Review of Systems  All other systems reviewed and are negative.     Allergies  Review of patient's allergies indicates no known allergies.  Home Medications   Prior to Admission medications   Medication Sig Start Date End Date Taking? Authorizing Provider  amoxicillin (AMOXIL) 500 MG capsule Take 1 capsule (500 mg total) by mouth 3 (three) times daily. 05/28/15  Yes Lonia Skinner Sofia, PA-C  sulfamethoxazole-trimethoprim (BACTRIM DS,SEPTRA DS) 800-160 MG tablet Take 1 tablet by mouth 2 (two) times daily. 05/31/15 06/07/15 Yes Gilda Crease, MD  traMADol (ULTRAM) 50 MG tablet Take 1 tablet (50 mg total) by mouth every 6 (six) hours as needed. Patient taking differently: Take 50 mg by mouth every 6 (six) hours as needed for moderate pain.  05/31/15  Yes Gilda Crease, MD  trimethoprim-polymyxin b (POLYTRIM) ophthalmic solution Place 1 drop into the right eye every 4 (four) hours. 05/31/15  Yes Gilda Crease, MD   BP 144/96 mmHg   Pulse 81  Temp(Src) 98.5 F (36.9 C) (Oral)  Resp 20  Ht  (1.727 m)  Wt 250 lb (113.399 kg)  BMI 38.02 kg/m2  SpO2 100% Physical Exam  Constitutional: He is oriented to person, place, and time. He appears well-developed and well-nourished. No distress.  HENT:  Head: Normocephalic.  Right Ear: External ear normal.  Left Ear: External ear normal.  Healing midoccipital wound, no drainage, bleeding or swelling.  Eyes: Conjunctivae and EOM are normal. Pupils are equal, round, and reactive to light.  Neck: Normal range of motion and phonation normal. Neck supple.  Cardiovascular: Normal rate, regular rhythm and normal heart sounds.   Pulmonary/Chest: Effort normal and breath sounds normal. He exhibits no bony tenderness.  Abdominal: Soft. He exhibits no mass. There is tenderness. There is no rebound and no guarding.  Left lower quadrant, mild  Musculoskeletal: Normal range of motion.  Neurological: He is alert and oriented to person, place, and time. No cranial nerve deficit or sensory deficit. He exhibits normal muscle tone. Coordination normal.  Skin: Skin is warm, dry and intact.  Psychiatric: He has a normal mood and affect. His behavior is normal. Judgment and thought content normal.  Nursing note and vitals reviewed.   ED Course  Procedures (including critical care time)  Medications  magnesium citrate solution 0.5 Bottle (0.5 Bottles Oral Given 06/04/15 1207)    Patient Vitals for the past 24 hrs:  BP Temp Temp src Pulse Resp SpO2 Height Weight  06/04/15  1445 144/96 mmHg 98.5 F (36.9 C) - 81 20 100 % - -  06/04/15 1200 138/85 mmHg - - 89 - 98 % - -  06/04/15 1130 141/94 mmHg - - 95 16 99 % - -  06/04/15 0949 132/83 mmHg 98.2 F (36.8 C) Oral 84 16 98 % 5\' 8"  (1.727 m) 250 lb (113.399 kg)    At discharge- Reevaluation with update and discussion. After initial assessment and treatment, an updated evaluation reveals he is more comfortable after having a large bowel  movement. He had no further complaints. Findings discussed with the patient, all questions were answered. [name]    Labs Review Labs Reviewed - No data to display  Imaging Review No results found. I have personally reviewed and evaluated these images and lab results as part of my medical decision-making.   EKG Interpretation None      MDM   Final diagnoses:  Slow transit constipation    Abdominal pain with constipation, improved after stool softener was given. Doubt bowel obstruction. Metabolic instability or serious bacterial infection.  Nursing Notes Reviewed/ Care Coordinated Applicable Imaging Reviewed Interpretation of Laboratory Data incorporated into ED treatment  The patient appears reasonably screened and/or stabilized for discharge and I doubt any other medical condition or other [facility] requiring further screening, evaluation, or treatment in the ED at this time prior to discharge.  Plan: Home Medications- stool softener when necessary at home.; Home Treatments- increase fiber in diet; return here if the recommended treatment, does not improve the symptoms; Recommended follow up- PCP of choice as needed   Mancel BaleElliott Matisse Salais, MD 06/05/15 [phone]

## 2015-06-04 NOTE — ED Notes (Signed)
Pt ambulatory to restroom

## 2015-06-04 NOTE — Discharge Instructions (Signed)
Drink the other half bottle of magnesium citrate, tomorrow. If you continue to have problems with constipation, use Colace 100 mg twice a day as needed.   Constipation, Adult Constipation is when a person has fewer than three bowel movements a week, has difficulty having a bowel movement, or has stools that are dry, hard, or larger than normal. As people grow older, constipation is more common. A low-fiber diet, not taking in enough fluids, and taking certain medicines may make constipation worse.  CAUSES   Certain medicines, such as antidepressants, pain medicine, iron supplements, antacids, and water pills.   Certain diseases, such as diabetes, irritable bowel syndrome (IBS), thyroid disease, or depression.   Not drinking enough water.   Not eating enough fiber-rich foods.   Stress or travel.   Lack of physical activity or exercise.   Ignoring the urge to have a bowel movement.   Using laxatives too much.  SIGNS AND SYMPTOMS   Having fewer than three bowel movements a week.   Straining to have a bowel movement.   Having stools that are hard, dry, or larger than normal.   Feeling full or bloated.   Pain in the lower abdomen.   Not feeling relief after having a bowel movement.  DIAGNOSIS  Your health care provider will take a medical history and perform a physical exam. Further testing may be done for severe constipation. Some tests may include:  A barium enema X-ray to examine your rectum, colon, and, sometimes, your small intestine.   A sigmoidoscopy to examine your lower colon.   A colonoscopy to examine your entire colon. TREATMENT  Treatment will depend on the severity of your constipation and what is causing it. Some dietary treatments include drinking more fluids and eating more fiber-rich foods. Lifestyle treatments may include regular exercise. If these diet and lifestyle recommendations do not help, your health care provider may recommend taking  over-the-counter laxative medicines to help you have bowel movements. Prescription medicines may be prescribed if over-the-counter medicines do not work.  HOME CARE INSTRUCTIONS   Eat foods that have a lot of fiber, such as fruits, vegetables, whole grains, and beans.  Limit foods high in fat and processed sugars, such as french fries, hamburgers, cookies, candies, and soda.   A fiber supplement may be added to your diet if you cannot get enough fiber from foods.   Drink enough fluids to keep your urine clear or pale yellow.   Exercise regularly or as directed by your health care provider.   Go to the restroom when you have the urge to go. Do not hold it.   Only take over-the-counter or prescription medicines as directed by your health care provider. Do not take other medicines for constipation without talking to your health care provider first.  SEEK IMMEDIATE MEDICAL CARE IF:   You have bright red blood in your stool.   Your constipation lasts for more than 4 days or gets worse.   You have abdominal or rectal pain.   You have thin, pencil-like stools.   You have unexplained weight loss. MAKE SURE YOU:   Understand these instructions.  Will watch your condition.  Will get help right away if you are not doing well or get worse.   This information is not intended to replace advice given to you by your health care provider. Make sure you discuss any questions you have with your health care provider.   Document Released: 04/10/2004 Document Revised: 08/03/2014 Document Reviewed: 04/24/2013  Elsevier Interactive Patient Education 2016 Elsevier Inc.  High-Fiber Diet Fiber, also called dietary fiber, is a type of carbohydrate found in fruits, vegetables, whole grains, and beans. A high-fiber diet can have many health benefits. Your health care provider may recommend a high-fiber diet to help:  Prevent constipation. Fiber can make your bowel movements more  regular.  Lower your cholesterol.  Relieve hemorrhoids, uncomplicated diverticulosis, or irritable bowel syndrome.  Prevent overeating as part of a weight-loss plan.  Prevent heart disease, type 2 diabetes, and certain cancers. WHAT IS MY PLAN? The recommended daily intake of fiber includes:  38 grams for men under age 43.  30 grams for men over age 43.  25 grams for women under age 43.  21 grams for women over age 650. You can get the recommended daily intake of dietary fiber by eating a variety of fruits, vegetables, grains, and beans. Your health care provider may also recommend a fiber supplement if it is not possible to get enough fiber through your diet. WHAT DO I NEED TO KNOW ABOUT A HIGH-FIBER DIET?  Fiber supplements have not been widely studied for their effectiveness, so it is better to get fiber through food sources.  Always check the fiber content on thenutrition facts label of any prepackaged food. Look for foods that contain at least 5 grams of fiber per serving.  Ask your dietitian if you have questions about specific foods that are related to your condition, especially if those foods are not listed in the following section.  Increase your daily fiber consumption gradually. Increasing your intake of dietary fiber too quickly may cause bloating, cramping, or gas.  Drink plenty of water. Water helps you to digest fiber. WHAT FOODS CAN I EAT? Grains Whole-grain breads. Multigrain cereal. Oats and oatmeal. Brown rice. Barley. Bulgur wheat. Millet. Bran muffins. Popcorn. Rye wafer crackers. Vegetables Sweet potatoes. Spinach. Kale. Artichokes. Cabbage. Broccoli. Green peas. Carrots. Squash. Fruits Berries. Pears. Apples. Oranges. Avocados. Prunes and raisins. Dried figs. Meats and Other Protein Sources Navy, kidney, pinto, and soy beans. Split peas. Lentils. Nuts and seeds. Dairy Fiber-fortified yogurt. Beverages Fiber-fortified soy milk. Fiber-fortified orange  juice. Other Fiber bars. The items listed above may not be a complete list of recommended foods or beverages. Contact your dietitian for more options. WHAT FOODS ARE NOT RECOMMENDED? Grains White bread. Pasta made with refined flour. White rice. Vegetables Fried potatoes. Canned vegetables. Well-cooked vegetables.  Fruits Fruit juice. Cooked, strained fruit. Meats and Other Protein Sources Fatty cuts of meat. Fried Environmental education officerpoultry or fried fish. Dairy Milk. Yogurt. Cream cheese. Sour cream. Beverages Soft drinks. Other Cakes and pastries. Butter and oils. The items listed above may not be a complete list of foods and beverages to avoid. Contact your dietitian for more information. WHAT ARE SOME TIPS FOR INCLUDING HIGH-FIBER FOODS IN MY DIET?  Eat a wide variety of high-fiber foods.  Make sure that half of all grains consumed each day are whole grains.  Replace breads and cereals made from refined flour or white flour with whole-grain breads and cereals.  Replace white rice with brown rice, bulgur wheat, or millet.  Start the day with a breakfast that is high in fiber, such as a cereal that contains at least 5 grams of fiber per serving.  Use beans in place of meat in soups, salads, or pasta.  Eat high-fiber snacks, such as berries, raw vegetables, nuts, or popcorn.   This information is not intended to replace advice given to you  by your health care provider. Make sure you discuss any questions you have with your health care provider.   Document Released: 07/13/2005 Document Revised: 08/03/2014 Document Reviewed: 12/26/2013 Elsevier Interactive Patient Education [employer] Elsevier Inc.
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
## 2017-05-02 ENCOUNTER — Encounter (HOSPITAL_COMMUNITY): Payer: Self-pay | Admitting: *Deleted

## 2017-05-02 ENCOUNTER — Emergency Department (HOSPITAL_COMMUNITY): Payer: Self-pay

## 2017-05-02 ENCOUNTER — Emergency Department (HOSPITAL_COMMUNITY)
Admission: EM | Admit: 2017-05-02 | Discharge: 2017-05-02 | Disposition: A | Discharge status: Home / Self Care | Payer: Self-pay | Attending: Emergency Medicine | Admitting: Emergency Medicine

## 2017-05-02 DIAGNOSIS — Z79899 Other long term (current) drug therapy: Secondary | ICD-10-CM | POA: Insufficient documentation

## 2017-05-02 DIAGNOSIS — W228XXA Striking against or struck by other objects, initial encounter: Secondary | ICD-10-CM | POA: Insufficient documentation

## 2017-05-02 DIAGNOSIS — Y939 Activity, unspecified: Secondary | ICD-10-CM | POA: Insufficient documentation

## 2017-05-02 DIAGNOSIS — Y999 Unspecified external cause status: Secondary | ICD-10-CM | POA: Insufficient documentation

## 2017-05-02 DIAGNOSIS — S99922A Unspecified injury of left foot, initial encounter: Secondary | ICD-10-CM | POA: Insufficient documentation

## 2017-05-02 DIAGNOSIS — Y929 Unspecified place or not applicable: Secondary | ICD-10-CM | POA: Insufficient documentation

## 2017-05-02 MED ORDER — SODIUM CHLORIDE 0.9 % IV BOLUS (SEPSIS)
1000.0000 mL | Freq: Once | INTRAVENOUS | Status: AC
  Administered 2017-05-02: 1000 mL via INTRAVENOUS
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
## 2017-05-02 NOTE — ED Triage Notes (Signed)
Pt has swelling to L great toe and L ankle onset tonight. Pt took three ibuprofen and rubbed cream onto L ankle. Pt reports since using cream, pt feels anxious with hot flashes.

## 2017-05-02 NOTE — ED Notes (Signed)
Declined W/C at D/C and was escorted to lobby by RN. 

## 2017-05-02 NOTE — Discharge Instructions (Signed)
Please read instructions below. Apply ice to your toe for 20 minutes at a time. Elevate it when possible. You can take  of advil/ibuprofen every 6 hours as needed for pain. Schedule an appointment with your PCP if your toe continues to hurt, or if you continue feeling anxious. Return to the ER for worsening redness of your toe, fever, or new or concerning symptoms.

## 2017-05-02 NOTE — ED Notes (Signed)
Patient transported to X-ray 

## 2017-06-20 ENCOUNTER — Other Ambulatory Visit: Payer: Self-pay

## 2017-06-20 ENCOUNTER — Encounter (HOSPITAL_COMMUNITY): Payer: Self-pay | Admitting: Family Medicine

## 2017-06-20 ENCOUNTER — Ambulatory Visit (HOSPITAL_COMMUNITY)
Admission: EM | Admit: 2017-06-20 | Discharge: 2017-06-20 | Disposition: A | Discharge status: Home / Self Care | Payer: Self-pay | Attending: Family Medicine | Admitting: Family Medicine

## 2017-06-20 DIAGNOSIS — L03011 Cellulitis of right finger: Secondary | ICD-10-CM

## 2017-06-20 MED ORDER — DOXYCYCLINE HYCLATE 100 MG PO TABS: 100.0000 mg | ORAL_TABLET | Freq: Two times a day (BID) | ORAL | 0 refills | Status: DC

## 2017-06-20 MED ORDER — DOXYCYCLINE HYCLATE 100 MG PO TABS
ORAL_TABLET | ORAL | Status: AC
  Filled 2017-06-20: qty 1

## 2017-06-20 MED ORDER — HYDROCODONE-ACETAMINOPHEN 5-325 MG PO TABS: 1.0000 | ORAL_TABLET | Freq: Four times a day (QID) | ORAL | 0 refills | Status: DC | PRN

## 2017-06-20 MED ORDER — LIDOCAINE HCL (PF) 1 % IJ SOLN
INTRAMUSCULAR | Status: AC
  Filled 2017-06-20: qty 2

## 2017-06-20 MED ORDER — DOXYCYCLINE HYCLATE 100 MG PO TABS
100.0000 mg | ORAL_TABLET | Freq: Once | ORAL | Status: AC
  Administered 2017-06-20: 100 mg via ORAL

## 2017-06-20 NOTE — ED Triage Notes (Signed)
Pt has pain and swelling around R pointerfinger nailbed x3 days

## 2017-06-20 NOTE — Discharge Instructions (Signed)
Return if pain and swelling persist for two days more  Continue to soak in warm soapy water several times a day this whole week.

## 2017-06-20 NOTE — ED Provider Notes (Addendum)
  [facility]   [id number] 06/20/17 Arrival Time: 1904   SUBJECTIVE:  Jordan Ewing is a 46 y.o. male who presents to the urgent care with complaint of swelling in finger, right index, for 6 days.  Worsening.     History reviewed. No pertinent past medical history. No family history on file. Social History   Socioeconomic History  . Marital status: Single    Spouse name: Not on file  . Number of children: Not on file  . Years of education: Not on file  . Highest education level: Not on file  Social Needs  . Financial resource strain: Not on file  . Food insecurity - worry: Not on file  . Food insecurity - inability: Not on file  . Transportation needs - medical: Not on file  . Transportation needs - non-medical: Not on file  Occupational History  . Not on file  Tobacco Use  . Smoking status: Never Smoker  . Smokeless tobacco: Never Used  Substance and Sexual Activity  . Alcohol use: Yes  . Drug use: Yes    Types: Marijuana  . Sexual activity: Not on file  Other Topics Concern  . Not on file  Social History Narrative  . Not on file   No outpatient medications have been marked as taking for the 06/20/17 encounter [facility](Hospital Encounter).   No Known Allergies    ROS: As per HPI, remainder of ROS negative.   OBJECTIVE:   Vitals:   06/20/17 1913  BP: (!) 143/94  Pulse: 74  Resp: 18  Temp: 97.9 F (36.6 C)  SpO2: 100%     General appearance: alert; no distress Eyes: PERRL; EOMI; conjunctiva normal HENT: normocephalic; atraumatic; external ears normal without trauma; nasal mucosa normal; oral mucosa normal Neck: supple Back: no CVA tenderness Extremities: no cyanosis or edema;  Skin: warm and dry Neurologic: normal gait; grossly normal Psychological: alert and cooperative; normal mood and affect      Labs:  Results for orders placed or performed during the hospital encounter of 12/27/14  Rapid strep screen   (If patient has fever  and/or without cough or runny nose)  Result Value Ref Range   Streptococcus, Group A Screen (Direct) NEGATIVE NEGATIVE  Culture, Group A Strep  Result Value Ref Range   Strep A Culture Comment (A)     Labs Reviewed - No data to display  No results found.     ASSESSMENT & PLAN:  1. Paronychia of right index finger     Meds ordered this encounter  Medications  . doxycycline (VIBRA-TABS) 100 MG tablet    Sig: Take 1 tablet (100 mg total) by mouth 2 (two) times daily.    Dispense:  20 tablet    Refill:  0  . HYDROcodone-acetaminophen (NORCO) 5-325 MG tablet    Sig: Take 1 tablet by mouth every 6 (six) hours as needed for moderate pain.    Dispense:  12 tablet    Refill:  0  . doxycycline (VIBRA-TABS) tablet 100 mg    Reviewed expectations re: course of current medical issues. Questions answered. Outlined signs and symptoms indicating need for more acute intervention. Patient verbalized understanding. After Visit Summary given.    Procedures:  Right index finger I&D'd after local 2% xylo infiltrated.  Large amount of liquid yellow pus      Elvina SidleLauenstein, Ryan Ogborn, MD 06/20/17 [place]on1936    Elvina SidleLauenstein, Takhia Spoon, MD 06/20/17 [phone]

## 2018-10-10 ENCOUNTER — Encounter (HOSPITAL_COMMUNITY): Payer: Self-pay | Admitting: Emergency Medicine

## 2018-10-10 ENCOUNTER — Other Ambulatory Visit: Payer: Self-pay

## 2018-10-10 ENCOUNTER — Ambulatory Visit (HOSPITAL_COMMUNITY)
Admission: EM | Admit: 2018-10-10 | Discharge: 2018-10-10 | Disposition: A | Discharge status: Home / Self Care | Payer: Self-pay | Attending: Family Medicine | Admitting: Family Medicine

## 2018-10-10 DIAGNOSIS — K529 Noninfective gastroenteritis and colitis, unspecified: Secondary | ICD-10-CM

## 2018-10-10 MED ORDER — ONDANSETRON 4 MG PO TBDP: 4.0000 mg | ORAL_TABLET | Freq: Three times a day (TID) | ORAL | 0 refills | Status: DC | PRN

## 2018-10-10 NOTE — ED Triage Notes (Signed)
Vomiting and diarrhea started early Sunday morning.  Diarrhea x 4 today, vomiting x 2 today.

## 2018-10-10 NOTE — Discharge Instructions (Addendum)

## 2018-10-10 NOTE — ED Provider Notes (Signed)
[facility]ARE [facility]   [id number] 10/10/18 Arrival Time: 1225  ASSESSMENT & PLAN:  1. Gastroenteritis    Meds ordered this encounter  Medications  . ondansetron (ZOFRAN-ODT) 4 MG disintegrating tablet    Sig: Take 1 tablet (4 mg total) by mouth every 8 (eight) hours as needed for nausea or vomiting.    Dispense:  15 tablet    Refill:  0   Discussed typical duration of symptoms for suspected viral GI illness. Hopefully will begin to improve over the next 24-48 hours. Work note provided. Will do his best to ensure adequate fluid intake in order to avoid dehydration. Will proceed to the Emergency Department for evaluation if unable to tolerate PO fluids regularly.  He will f/u with his PCP or here if not showing improvement over the next 48-72 hours.  Reviewed expectations re: course of current medical issues. Questions answered. Outlined signs and symptoms indicating need for more acute intervention. Patient verbalized understanding. After Visit Summary given.   SUBJECTIVE: History from: patient.  Jordan Ewing is a 48 y.o. male who presents with complaint of non-bilious, non-bloody intermittent nausea and vomiting of brown material with non-bloody diarrhea. Onset approx 2 days ago. Abdominal discomfort: mild and cramping. Symptoms are stable since beginning. Aggravating factors: eating. Alleviating factors: none. Associated symptoms: fatigue. He denies arthralgias, belching, chills, fever and headache. Appetite: decreased. PO intake: decreased. Ambulatory without assistance. Urinary symptoms: none. Sick contacts: none. Recent travel or camping: none. OTC treatment: none.  Past Surgical History:  Procedure Laterality Date  . LEG SURGERY     ROS: As per HPI. All other systems negative.  OBJECTIVE:  Vitals:   10/10/18 1307  BP: 118/80  Pulse: (!) 120  Resp: 18  Temp: 97.6 F (36.4 C)  TempSrc: Temporal  SpO2: 97%    Recheck pulse: 108  General appearance:  alert; no distress Oropharynx: moist Lungs: clear to auscultation bilaterally; unlabored Heart: regular rate and rhythm Abdomen: soft; non-distended; no significant abdominal tenderness; reports "cramping" feeling; bowel sounds present; no masses or organomegaly; no guarding or rebound tenderness Back: no CVA tenderness Extremities: no edema; symmetrical with no gross deformities Skin: warm; dry Neurologic: normal gait Psychological: alert and cooperative; normal mood and affect  No Known Allergies                                             PMH: Paronychia.  Social History   Socioeconomic History  . Marital status: Single    Spouse name: Not on file  . Number of children: Not on file  . Years of education: Not on file  . Highest education level: Not on file  Occupational History  . Not on file  Social Needs  . Financial resource strain: Not on file  . Food insecurity:    Worry: Not on file    Inability: Not on file  . Transportation needs:    Medical: Not on file    Non-medical: Not on file  Tobacco Use  . Smoking status: Never Smoker  . Smokeless tobacco: Never Used  Substance and Sexual Activity  . Alcohol use: Yes  . Drug use: Not Currently    Types: Marijuana  . Sexual activity: Not on file  Lifestyle  . Physical activity:    Days per week: Not on file    Minutes per session: Not on file  .  Stress: Not on file  Relationships  . Social connections:    Talks on phone: Not on file    Gets together: Not on file    Attends religious service: Not on file    Active member of club or organization: Not on file    Attends meetings of clubs or organizations: Not on file    Relationship status: Not on file  . Intimate partner violence:    Fear of current or ex partner: Not on file    Emotionally abused: Not on file    Physically abused: Not on file    Forced sexual activity: Not on file  Other Topics Concern  . Not on file  Social History Narrative  . Not on  file   History reviewed. No pertinent family history.   Mardella Layman, MD 10/10/18 1323

## 2019-02-09 ENCOUNTER — Ambulatory Visit (HOSPITAL_COMMUNITY)
Admission: EM | Admit: 2019-02-09 | Discharge: 2019-02-09 | Disposition: A | Discharge status: Home / Self Care | Payer: HRSA Program | Attending: Internal Medicine | Admitting: Internal Medicine

## 2019-02-09 ENCOUNTER — Encounter (HOSPITAL_COMMUNITY): Payer: Self-pay

## 2019-02-09 ENCOUNTER — Other Ambulatory Visit: Payer: Self-pay

## 2019-02-09 DIAGNOSIS — U071 COVID-19: Secondary | ICD-10-CM | POA: Insufficient documentation

## 2019-02-09 DIAGNOSIS — R030 Elevated blood-pressure reading, without diagnosis of hypertension: Secondary | ICD-10-CM | POA: Insufficient documentation

## 2019-02-09 DIAGNOSIS — Z79899 Other long term (current) drug therapy: Secondary | ICD-10-CM | POA: Insufficient documentation

## 2019-02-09 DIAGNOSIS — J029 Acute pharyngitis, unspecified: Secondary | ICD-10-CM | POA: Diagnosis present

## 2019-02-09 DIAGNOSIS — Z20828 Contact with and (suspected) exposure to other viral communicable diseases: Secondary | ICD-10-CM

## 2019-02-09 DIAGNOSIS — F17290 Nicotine dependence, other tobacco product, uncomplicated: Secondary | ICD-10-CM | POA: Insufficient documentation

## 2019-02-09 DIAGNOSIS — Z20822 Contact with and (suspected) exposure to covid-19: Secondary | ICD-10-CM

## 2019-02-09 LAB — POCT RAPID STREP A: Streptococcus, Group A Screen (Direct): NEGATIVE

## 2019-02-09 MED ORDER — ACETAMINOPHEN 325 MG PO TABS: 650.0000 mg | ORAL_TABLET | Freq: Four times a day (QID) | ORAL | Status: DC | PRN

## 2019-02-09 NOTE — ED Triage Notes (Signed)
Patient presents to Urgent Care with complaints of sore throat since this morning. Patient reports it felt scratchy last night, and then it was sore this morning, states it feels sharp when he swallows like when he had strep throat in the past.

## 2019-02-09 NOTE — ED Provider Notes (Signed)
Jordan Ewing    CSN: [id number] Arrival date & time: 02/09/19  1158      History   Chief Complaint Chief Complaint  Patient presents with  . Sore Throat    HPI Jordan Ewing is a 48 y.o. male with no past medical history comes to urgent care today complaining of a one-week history of sore throat.  Symptoms started yesterday and has been persistent since his visit to the urgent care.  Patient has dysphagia for both solids and liquids this morning.  He denies any fever, chills, body aches, nausea vomiting.  No diarrhea, change in taste or smell.  Patient works in [employer] and admits to having contact with possible COVID-19 patient.  Some of his work colleagues have been following significant COVID-19.   HPI  History reviewed. No pertinent past medical history.  There are no active problems to display for this patient.   Past Surgical History:  Procedure Laterality Date  . LEG SURGERY         Home Medications    Prior to Admission medications   Medication Sig Start Date End Date Taking? Authorizing Provider  doxycycline (VIBRA-TABS) 100 MG tablet Take 1 tablet (100 mg total) by mouth 2 (two) times daily. 06/20/17   Robyn Haber, MD  HYDROcodone-acetaminophen (NORCO) 5-325 MG tablet Take 1 tablet by mouth every 6 (six) hours as needed for moderate pain. 06/20/17   Robyn Haber, MD  ondansetron (ZOFRAN-ODT) 4 MG disintegrating tablet Take 1 tablet (4 mg total) by mouth every 8 (eight) hours as needed for nausea or vomiting. 10/10/18   Vanessa Kick, MD    Family History Family History  Problem Relation Age of Onset  . Healthy Mother   . Healthy Father     Social History Social History   Tobacco Use  . Smoking status: Current Every Day Smoker    Types: Cigars  . Smokeless tobacco: Never Used  . Tobacco comment: black and milds  Substance Use Topics  . Alcohol use: Yes  . Drug use: Not Currently    Types: Marijuana     Allergies    Patient has no known allergies.   Review of Systems Review of Systems  Constitutional: Negative for activity change, appetite change, chills, fatigue and fever.  HENT: Positive for sneezing and sore throat. Negative for postnasal drip and rhinorrhea.   Respiratory: Negative for cough, chest tightness, shortness of breath and wheezing.   Gastrointestinal: Negative for abdominal distention, abdominal pain, diarrhea and vomiting.  Endocrine: Negative.   Genitourinary: Negative.   Musculoskeletal: Negative for arthralgias, back pain, joint swelling and myalgias.  Skin: Negative for rash and wound.     Physical Exam Triage Vital Signs ED Triage Vitals  Enc Vitals Group     BP 02/09/19 1214 (!) 173/96     Pulse Rate 02/09/19 1214 96     Resp 02/09/19 1214 17     Temp 02/09/19 1214 98.3 F (36.8 C)     Temp Source 02/09/19 1214 Oral     SpO2 02/09/19 1214 100 %     Weight --      Height --      Head Circumference --      Peak Flow --      Pain Score 02/09/19 1210 4     Pain Loc --      Pain Edu? --      Excl. in [place]? --    No data found.  Updated Vital  Signs BP (!) 173/96 (BP Location: Right Arm)   Pulse 96   Temp 98.3 F (36.8 C) (Oral)   Resp 17   SpO2 100%   Visual Acuity Right Eye Distance:   Left Eye Distance:   Bilateral Distance:    Right Eye Near:   Left Eye Near:    Bilateral Near:     Physical Exam Constitutional:      General: He is not in acute distress.    Appearance: He is well-developed. He is ill-appearing. He is not toxic-appearing.  HENT:     Nose: No rhinorrhea.     Mouth/Throat:     Mouth: Mucous membranes are moist. No oral lesions.     Pharynx: No posterior oropharyngeal erythema.     Tonsils: No tonsillar exudate or tonsillar abscesses. 0 on the right. 0 on the left.  Eyes:     Conjunctiva/sclera: Conjunctivae normal.  Neck:     Musculoskeletal: Normal range of motion.     Thyroid: No thyromegaly.  Cardiovascular:     Rate and  Rhythm: Normal rate and regular rhythm.     Heart sounds: Normal heart sounds. No murmur. No gallop.   Pulmonary:     Effort: Pulmonary effort is normal. No respiratory distress.     Breath sounds: Normal breath sounds. No wheezing or rales.  Abdominal:     Palpations: Abdomen is soft.  Lymphadenopathy:     Cervical: No cervical adenopathy.  Skin:    General: Skin is warm.     Capillary Refill: Capillary refill takes less than 2 seconds.  Neurological:     General: No focal deficit present.     Mental Status: He is alert and oriented to person, place, and time.      UC Treatments / Results  Labs (all labs ordered are listed, but only abnormal results are displayed) Labs Reviewed  POCT RAPID STREP A    EKG   Radiology No results found.  Procedures Procedures (including critical care time)  Medications Ordered in UC Medications - No data to display  Initial Impression / Assessment and Plan / UC Course  I have reviewed the triage vital signs and the nursing notes.  Pertinent labs & imaging results that were available during my care of the patient were reviewed by me and considered in my medical decision making (see chart for details).     1.  Sudden onset sore throat: Strep screen is negative Send throat cultures Anterior nasal chamber swab for COVID-19 under direct observation from the provider. COVID-19 precautions given Patient is encouraged to sign up for my chart We will follow-up with the patient once the results are available  2.  Elevated blood pressure without the diagnosis of hypertension Serial blood pressure monitoring in the home setting is recommended. Final Clinical Impressions(s) / UC Diagnoses   Final diagnoses:  Acute pharyngitis, unspecified etiology  Suspected Covid-19 Virus Infection   Discharge Instructions   None    ED Prescriptions    None     Controlled Substance Prescriptions [place] Controlled Substance Registry consulted? No    Merrilee JanskyLamptey, Philip O, MD 02/09/19 1301

## 2019-02-10 ENCOUNTER — Telehealth (HOSPITAL_COMMUNITY): Payer: Self-pay | Admitting: Emergency Medicine

## 2019-02-10 LAB — NOVEL CORONAVIRUS, NAA (HOSP ORDER, SEND-OUT TO REF LAB; TAT 18-24 HRS): SARS-CoV-2, NAA: DETECTED — AB

## 2019-02-10 NOTE — Telephone Encounter (Signed)
Your test for COVID-19 was positive, meaning that you were infected with the novel coronavirus and could give the germ to others.  Please continue isolation at home, for at least 10 days since the start of your fever/cough/breathlessness and until you have had 3 consecutive days without fever (without taking a fever reducer) and with cough/breathlessness improving. Please continue good preventive care measures, including:  frequent hand-washing, avoid touching your face, cover coughs/sneezes, stay out of crowds and keep a 6 foot distance from others.  Recheck or go to the nearest hospital ED tent for re-assessment if fever/cough/breathlessness return.  Patient contacted and made aware of all results, all questions answered.   

## 2019-02-11 LAB — CULTURE, GROUP A STREP (THRC)

## 2019-10-27 ENCOUNTER — Ambulatory Visit: Payer: Self-pay

## 2020-04-07 ENCOUNTER — Encounter (HOSPITAL_COMMUNITY): Payer: Self-pay | Admitting: Emergency Medicine

## 2020-04-07 ENCOUNTER — Ambulatory Visit (INDEPENDENT_AMBULATORY_CARE_PROVIDER_SITE_OTHER): Payer: Self-pay

## 2020-04-07 ENCOUNTER — Ambulatory Visit (HOSPITAL_COMMUNITY)
Admission: EM | Admit: 2020-04-07 | Discharge: 2020-04-07 | Disposition: A | Discharge status: Home / Self Care | Payer: Self-pay | Attending: Physician Assistant | Admitting: Physician Assistant

## 2020-04-07 DIAGNOSIS — M79671 Pain in right foot: Secondary | ICD-10-CM

## 2020-04-07 DIAGNOSIS — M7989 Other specified soft tissue disorders: Secondary | ICD-10-CM

## 2020-04-07 DIAGNOSIS — S99921A Unspecified injury of right foot, initial encounter: Secondary | ICD-10-CM

## 2020-04-07 MED ORDER — NAPROXEN 500 MG PO TABS: 500.0000 mg | ORAL_TABLET | Freq: Two times a day (BID) | ORAL | 0 refills | Status: AC

## 2020-04-07 NOTE — ED Provider Notes (Signed)
[facility]    CSN: [id number] Arrival date & time: 04/07/20  1043      History   Chief Complaint Chief Complaint  Patient presents with  . Toe Injury    HPI Jordan Ewing is a 49 y.o. male.   Patient reports for right foot and big toe pain.  He reports 2 days ago on Friday he hit the side of his big toe on the bed frame.  Reports quite a bit of pain at that time.  He reports swelling is developed around the big toe and across the top of his foot.  He reports there is continued pain with walking however minimal pain at rest.  He is concerned about the swelling.  Denies any fevers or chills.  No history of gout.  Denies any numbness tingling or weakness in the foot.  Patient has been using an old orthopedic boot that he had for some he also has significantly improved his ability to walk.     History reviewed. No pertinent past medical history.  There are no problems to display for this patient.   Past Surgical History:  Procedure Laterality Date  . LEG SURGERY         Home Medications    Prior to Admission medications   Medication Sig Start Date End Date Taking? Authorizing Provider  acetaminophen (TYLENOL) 325 MG tablet Take 2 tablets (650 mg total) by mouth every 6 (six) hours as needed. 02/09/19   Lamptey, Britta Mccreedy, MD  naproxen (NAPROSYN) 500 MG tablet Take 1 tablet (500 mg total) by mouth 2 (two) times daily for 10 days. 04/07/20 04/17/20  Vlasta Baskin, Veryl Speak, PA-C    Family History Family History  Problem Relation Age of Onset  . Healthy Mother   . Healthy Father     Social History Social History   Tobacco Use  . Smoking status: Current Every Day Smoker    Types: Cigars  . Smokeless tobacco: Never Used  . Tobacco comment: black and milds  Vaping Use  . Vaping Use: Never used  Substance Use Topics  . Alcohol use: Yes  . Drug use: Not Currently    Types: Marijuana     Allergies   Patient has no known allergies.   Review of  Systems Review of Systems   Physical Exam Triage Vital Signs ED Triage Vitals  Enc Vitals Group     BP 04/07/20 1203 (!) 158/107     Pulse Rate 04/07/20 1203 64     Resp 04/07/20 1203 16     Temp 04/07/20 1203 (!) 97.5 F (36.4 C)     Temp Source 04/07/20 1203 Oral     SpO2 04/07/20 1203 98 %     Weight --      Height --      Head Circumference --      Peak Flow --      Pain Score 04/07/20 1201 5     Pain Loc --      Pain Edu? --      Excl. in [place]? --    No data found.  Updated Vital Signs BP (!) 158/107 (BP Location: Left Arm)   Pulse 64   Temp (!) 97.5 F (36.4 C) (Oral)   Resp 16   SpO2 98%   Visual Acuity Right Eye Distance:   Left Eye Distance:   Bilateral Distance:    Right Eye Near:   Left Eye Near:    Bilateral Near:  Physical Exam Vitals and nursing note reviewed.  Musculoskeletal:     Comments: Right foot with swelling across the metatarsal heads of the first second and third.  There is some medial swelling at the first metatarsal.  Mild erythema across the dorsum of the foot, however not warm joint.  Freely moving all digits.  Able to bear weight, however has pain with this.  Dorsalis pedis pulse 2+.  No swelling of the proximal foot or leg.  Sensation is intact.  Cap refill less than 2 seconds      UC Treatments / Results  Labs (all labs ordered are listed, but only abnormal results are displayed) Labs Reviewed - No data to display  EKG   Radiology DG Foot Complete Right  Result Date: 04/07/2020 CLINICAL DATA:  Injury to RIGHT great toe on Friday, ran into corner of bed frame, swelling of 3 toes mostly great toe EXAM: RIGHT FOOT COMPLETE - 3+ VIEW COMPARISON:  None FINDINGS: Osseous mineralization normal. Hallux valgus with significant soft tissue swelling medial to the first MTP joint and at dorsum of forefoot. Joint spaces preserved. No acute fracture, dislocation, or bone destruction. IMPRESSION: Soft tissue swelling without acute osseous  abnormalities. Hallux valgus. Electronically Signed   By: Ulyses Southward M.D.   On: 04/07/2020 12:59    Procedures Procedures (including critical care time)  Medications Ordered in UC Medications - No data to display  Initial Impression / Assessment and Plan / UC Course  I have reviewed the triage vital signs and the nursing notes.  Pertinent labs & imaging results that were available during my care of the patient were reviewed by me and considered in my medical decision making (see chart for details).     #Right foot pain #Right foot swelling Patient is 49 year old presenting with pain and swelling in the right foot.  X-ray negative for fracture.  Likely this is inflammatory after the trauma.  Doubt infectious.  Patient has a cam walker boot with him, will have him continue use this.  Will trial naproxen, elevation and ice.  Recommended if he is not improving over the next 24 hours follow-up with sports medicine group.  Discussed other return, follow-up in emergency department precautions.  Patient verbalized understand plan of care Final Clinical Impressions(s) / UC Diagnoses   Final diagnoses:  Right foot pain  Foot swelling     Discharge Instructions     There were no broken bones I do not think this infection Think this is inflammation  Take the naproxen with food as prescribed  Elevate and ice your foot.  If not improving over the next 24 hours call the sports medicine center for follow-up  You may also follow-up with your primary care as needed if your develop fever or suddenly worsening swelling return for reevaluation      ED Prescriptions    Medication Sig Dispense Auth. Provider   naproxen (NAPROSYN) 500 MG tablet Take 1 tablet (500 mg total) by mouth 2 (two) times daily for 10 days. 20 tablet [place], Veryl Speak, PA-C     PDMP not reviewed this encounter.   Hermelinda Medicus, PA-C 04/07/20 2147

## 2020-04-07 NOTE — Discharge Instructions (Signed)
There were no broken bones I do not think this infection Think this is inflammation  Take the naproxen with food as prescribed  Elevate and ice your foot.  If not improving over the next 24 hours call the sports medicine center for follow-up  You may also follow-up with your primary care as needed if your develop fever or suddenly worsening swelling return for reevaluation

## 2020-04-07 NOTE — ED Triage Notes (Signed)
Pt c/o right big toe injury onset Friday. He states he ran into the corner of the bed frame. Pt states three of the toes on his right foot are swollen now. Pt states he has been taking Tylenol PM and OTC pain reliever.

## 2021-07-12 ENCOUNTER — Other Ambulatory Visit: Payer: Self-pay

## 2021-07-12 ENCOUNTER — Encounter (HOSPITAL_COMMUNITY): Payer: Self-pay | Admitting: *Deleted

## 2021-07-12 ENCOUNTER — Emergency Department (HOSPITAL_COMMUNITY): Payer: BC Managed Care – PPO

## 2021-07-12 ENCOUNTER — Emergency Department (HOSPITAL_COMMUNITY)
Admission: EM | Admit: 2021-07-12 | Discharge: 2021-07-13 | Disposition: A | Discharge status: Home / Self Care | Payer: BC Managed Care – PPO | Attending: Emergency Medicine | Admitting: Emergency Medicine

## 2021-07-12 DIAGNOSIS — R1031 Right lower quadrant pain: Secondary | ICD-10-CM | POA: Diagnosis present

## 2021-07-12 DIAGNOSIS — R935 Abnormal findings on diagnostic imaging of other abdominal regions, including retroperitoneum: Secondary | ICD-10-CM | POA: Diagnosis not present

## 2021-07-12 DIAGNOSIS — K5900 Constipation, unspecified: Secondary | ICD-10-CM

## 2021-07-12 DIAGNOSIS — R197 Diarrhea, unspecified: Secondary | ICD-10-CM | POA: Insufficient documentation

## 2021-07-12 DIAGNOSIS — F17290 Nicotine dependence, other tobacco product, uncomplicated: Secondary | ICD-10-CM | POA: Diagnosis not present

## 2021-07-12 LAB — URINALYSIS, ROUTINE W REFLEX MICROSCOPIC
Bilirubin Urine: NEGATIVE
Glucose, UA: NEGATIVE mg/dL
Ketones, ur: NEGATIVE mg/dL
Leukocytes,Ua: NEGATIVE
Nitrite: NEGATIVE
Protein, ur: NEGATIVE mg/dL
Specific Gravity, Urine: 1.02 (ref 1.005–1.030)
pH: 7.5 (ref 5.0–8.0)

## 2021-07-12 LAB — URINALYSIS, MICROSCOPIC (REFLEX)

## 2021-07-12 NOTE — ED Triage Notes (Signed)
Abd and flank pain for 3 days  he has been constipated for 3 days  laxatives are not working

## 2021-07-12 NOTE — ED Provider Notes (Signed)
Emergency Medicine Provider Triage Evaluation Note  Jordan Ewing , a 50 y.o. male  was evaluated in triage.  Pt complains of right back pain right lower abdominal pain that radiates to scrotum   Review of Systems  Positive: Constipation  Negative: fever  Physical Exam  BP (!) 168/83    Pulse (!) 56    Temp 98.7 F (37.1 C) (Oral)    Resp 16    Ht 5\' 8"  (1.727 m)    Wt 113.4 kg    SpO2 94%    BMI 38.01 kg/m  Gen:   Awake, no distress   Resp:  Normal effort  MSK:   Moves extremities without difficulty  Other:  Abdomen nontender   Medical Decision Making  Medically screening exam initiated at 8:23 PM.  Appropriate orders placed.  Jordan Ewing was informed that the remainder of the evaluation will be completed by another provider, this initial triage assessment does not replace that evaluation, and the importance of remaining in the ED until their evaluation is complete.     Michail Jewels, Elson Areas 07/12/21 2026    2027, MD 07/16/21 1326

## 2021-07-13 LAB — CBC WITH DIFFERENTIAL/PLATELET
Abs Immature Granulocytes: 0.03 10*3/uL (ref 0.00–0.07)
Basophils Absolute: 0 10*3/uL (ref 0.0–0.1)
Basophils Relative: 0 %
Eosinophils Absolute: 0.1 10*3/uL (ref 0.0–0.5)
Eosinophils Relative: 1 %
HCT: 39.6 % (ref 39.0–52.0)
Hemoglobin: 13.9 g/dL (ref 13.0–17.0)
Immature Granulocytes: 0 %
Lymphocytes Relative: 18 %
Lymphs Abs: 1.9 10*3/uL (ref 0.7–4.0)
MCH: 31.7 pg (ref 26.0–34.0)
MCHC: 35.1 g/dL (ref 30.0–36.0)
MCV: 90.2 fL (ref 80.0–100.0)
Monocytes Absolute: 0.7 10*3/uL (ref 0.1–1.0)
Monocytes Relative: 6 %
Neutro Abs: 8.2 10*3/uL — ABNORMAL HIGH (ref 1.7–7.7)
Neutrophils Relative %: 75 %
Platelets: 369 10*3/uL (ref 150–400)
RBC: 4.39 MIL/uL (ref 4.22–5.81)
RDW: 11.8 % (ref 11.5–15.5)
WBC: 10.9 10*3/uL — ABNORMAL HIGH (ref 4.0–10.5)
nRBC: 0 % (ref 0.0–0.2)

## 2021-07-13 LAB — COMPREHENSIVE METABOLIC PANEL
ALT: 15 U/L (ref 0–44)
AST: 18 U/L (ref 15–41)
Albumin: 3.6 g/dL (ref 3.5–5.0)
Alkaline Phosphatase: 47 U/L (ref 38–126)
Anion gap: 8 (ref 5–15)
BUN: 6 mg/dL (ref 6–20)
CO2: 26 mmol/L (ref 22–32)
Calcium: 9 mg/dL (ref 8.9–10.3)
Chloride: 102 mmol/L (ref 98–111)
Creatinine, Ser: 1.19 mg/dL (ref 0.61–1.24)
GFR, Estimated: >60 mL/min (ref >60)
Glucose, Bld: 110 mg/dL — ABNORMAL HIGH (ref 70–99)
Potassium: 3.5 mmol/L (ref 3.5–5.1)
Sodium: 136 mmol/L (ref 135–145)
Total Bilirubin: 0.9 mg/dL (ref 0.3–1.2)
Total Protein: 7.5 g/dL (ref 6.5–8.1)

## 2021-07-13 NOTE — ED Notes (Signed)
Pt phone number in chart verified with patient to ensure he get the phone call from social work

## 2021-07-13 NOTE — ED Provider Notes (Signed)
[facility] EMERGENCY DEPARTMENT Provider Note   CSN: [id number] Arrival date & time: 07/12/21  1923     History Chief Complaint  Patient presents with   Abdominal Pain    Jordan Ewing is a 50 y.o. male.  The history is provided by the patient and medical records.  Abdominal Pain Jordan Ewing is a 50 y.o. male who presents to the Emergency Department complaining of constipation.    Since Monday, had decreased bowel movements - only a small amount every Monday.  Yesterday he used milk of magnesia.  Last night he had diarrhea.  This morning he strained to have a bowel movement.  He took more milk of magnesia and had another bowel movement (loose/watery) when he arrived to the ED.  He is urinating without difficulty.    No fever, chest pain, sob, N/V. He had some lower abdominal pain - RLQ and right side back - around 4pm.  His pain went away after he took ibuprofen.   No night sweats, weight loss.   No known medical problems, no medications.  Uses marijuana occasionally.  No alcohol  Family hx DM.      History reviewed. No pertinent past medical history.  There are no problems to display for this patient.   Past Surgical History:  Procedure Laterality Date   LEG SURGERY         Family History  Problem Relation Age of Onset   Healthy Mother    Healthy Father     Social History   Tobacco Use   Smoking status: Every Day    Types: Cigars   Smokeless tobacco: Never   Tobacco comments:    black and milds  Vaping Use   Vaping Use: Never used  Substance Use Topics   Alcohol use: Yes   Drug use: Not Currently    Types: Marijuana    Home Medications Prior to Admission medications   Medication Sig Start Date End Date Taking? Authorizing Provider  acetaminophen (TYLENOL) 325 MG tablet Take 2 tablets (650 mg total) by mouth every 6 (six) hours as needed. 02/09/19   Lamptey, Britta Mccreedy, MD    Allergies    Patient has no known  allergies.  Review of Systems   Review of Systems  Gastrointestinal:  Positive for abdominal pain.  All other systems reviewed and are negative.  Physical Exam Updated Vital Signs BP (!) 152/87    Pulse (!) 56    Temp 98 F (36.7 C) (Oral)    Resp 17    Ht 5\' 8"  (1.727 m)    Wt 113.4 kg    SpO2 95%    BMI 38.01 kg/m   Physical Exam Vitals and nursing note reviewed.  Constitutional:      Appearance: He is well-developed.  HENT:     Head: Normocephalic and atraumatic.  Cardiovascular:     Rate and Rhythm: Normal rate and regular rhythm.     Heart sounds: No murmur heard. Pulmonary:     Effort: Pulmonary effort is normal. No respiratory distress.     Breath sounds: Normal breath sounds.  Abdominal:     Palpations: Abdomen is soft.     Tenderness: There is no abdominal tenderness. There is no guarding or rebound.  Musculoskeletal:        General: No swelling or tenderness.  Skin:    General: Skin is warm and dry.  Neurological:     Mental Status: He is alert and  oriented to person, place, and time.  Psychiatric:        Behavior: Behavior normal.    ED Results / Procedures / Treatments   Labs (all labs ordered are listed, but only abnormal results are displayed) Labs Reviewed  URINALYSIS, ROUTINE W REFLEX MICROSCOPIC - Abnormal; Notable for the following components:      Result Value   Hgb urine dipstick TRACE (*)    All other components within normal limits  URINALYSIS, MICROSCOPIC (REFLEX) - Abnormal; Notable for the following components:   Bacteria, UA RARE (*)    All other components within normal limits  COMPREHENSIVE METABOLIC PANEL - Abnormal; Notable for the following components:   Glucose, Bld 110 (*)    All other components within normal limits  CBC WITH DIFFERENTIAL/PLATELET - Abnormal; Notable for the following components:   WBC 10.9 (*)    Neutro Abs 8.2 (*)    All other components within normal limits    EKG None  Radiology CT Renal Stone  Study  Result Date: 07/12/2021 CLINICAL DATA:  Right abdominal and flank pain. EXAM: CT ABDOMEN AND PELVIS WITHOUT CONTRAST TECHNIQUE: Multidetector CT imaging of the abdomen and pelvis was performed following the standard protocol without IV contrast. COMPARISON:  None. FINDINGS: Lower chest: No acute abnormality. Hepatobiliary: No focal liver abnormality is seen. No gallstones, gallbladder wall thickening, or biliary dilatation. Pancreas: Unremarkable. No pancreatic ductal dilatation or surrounding inflammatory changes. Spleen: Normal in size without focal abnormality. Adrenals/Urinary Tract: There is mild right-sided hydroureteronephrosis to the level of the pelvic inlet. No urinary tract calculi are identified. Otherwise, the kidneys, adrenal glands and bladder are within normal limits. Stomach/Bowel: Stomach is within normal limits. Appendix appears normal. No evidence of bowel wall thickening, distention, or inflammatory changes. Vascular/Lymphatic: Aortic atherosclerosis. The aorta and IVC are normal in size. There are atherosclerotic calcifications of the aorta. There is likely abnormal soft tissue in the retroperitoneum at the level of the aortic bifurcation with loss of fat plane surrounding the aorta, aortic bifurcation, and adjacent venous structures. This measures up to proximally 2.3 cm in thickness in AP dimension. No other discrete separate enlarged lymph nodes are identified. Reproductive: Prostate is unremarkable. Other: No abdominal wall hernia or abnormality. No abdominopelvic ascites. Musculoskeletal: There is a small fat containing umbilical hernia. No evidence for fracture. No focal osseous lesion. IMPRESSION: 1. Findings concerning for abnormal retroperitoneal soft tissue at the level of the aortic bifurcation measuring up to 2.3 cm in AP dimension. Differential diagnosis includes retroperitoneal fibrosis and other retroperitoneal neoplasm such as sarcoma. 2. Mild right-sided  hydronephrosis likely due to obstruction of the right ureter at the level of the retroperitoneal process. No urinary tract calculus identified. Electronically Signed   By: Darliss Cheney M.D.   On: 07/12/2021 20:59    Procedures Procedures   Medications Ordered in ED Medications - No data to display  ED Course  I have reviewed the triage vital signs and the nursing notes.  Pertinent labs & imaging results that were available during my care of the patient were reviewed by me and considered in my medical decision making (see chart for details).    MDM Rules/Calculators/A&P                         Patient here for evaluation of complaints of constipation, right lower quadrant and right flank pain.  He had a bowel movement prior to assessment by myself in the emergency department.  His flank pain was resolved after he took ibuprofen.  He is nontoxic-appearing on evaluation with no respiratory distress.  UA not consistent with UTI.  CT stone study was obtained, which demonstrates mild right hydroureter as well as retroperitoneal mass.  Discussed with patient findings of studies and importance of close follow-up for further tissue diagnosis.  Discussed importance of establishing PCP for further work-up.  Case management referral placed for assistance in establishing a PCP so he can have prompt follow-up.  Home care for constipation as well as return precautions discussed.    Final Clinical Impression(s) / ED Diagnoses Final diagnoses:  Right lower quadrant abdominal pain  Constipation, unspecified constipation type  Abnormal abdominal CT scan    Rx / DC Orders ED Discharge Orders     None        Tilden Fossa, MD 07/13/21 [phone]

## 2021-07-13 NOTE — ED Notes (Signed)
Pt reports having a BM about 20 minutes ago and relief from the pressure in his stomach

## 2021-07-13 NOTE — Discharge Instructions (Addendum)
Your CT scan showed a possible mass in your abdomen that need to be further evaluated by your family doctor.  Please call to get the soonest available appointment for further evaluation.  Get rechecked if you have new or concerning symptoms.    You can use colace and miralax, available over the counter according to label instructions.

## 2021-11-18 ENCOUNTER — Ambulatory Visit
Admission: EM | Admit: 2021-11-18 | Discharge: 2021-11-18 | Disposition: A | Discharge status: Home / Self Care | Payer: BC Managed Care – PPO | Attending: Urgent Care | Admitting: Urgent Care

## 2021-11-18 ENCOUNTER — Other Ambulatory Visit: Payer: Self-pay

## 2021-11-18 ENCOUNTER — Ambulatory Visit (INDEPENDENT_AMBULATORY_CARE_PROVIDER_SITE_OTHER): Payer: BC Managed Care – PPO

## 2021-11-18 ENCOUNTER — Encounter: Payer: Self-pay | Admitting: *Deleted

## 2021-11-18 DIAGNOSIS — R0602 Shortness of breath: Secondary | ICD-10-CM

## 2021-11-18 DIAGNOSIS — J209 Acute bronchitis, unspecified: Secondary | ICD-10-CM

## 2021-11-18 DIAGNOSIS — F1210 Cannabis abuse, uncomplicated: Secondary | ICD-10-CM

## 2021-11-18 DIAGNOSIS — R0981 Nasal congestion: Secondary | ICD-10-CM

## 2021-11-18 DIAGNOSIS — I10 Essential (primary) hypertension: Secondary | ICD-10-CM

## 2021-11-18 MED ORDER — LOSARTAN POTASSIUM 50 MG PO TABS: 50.0000 mg | ORAL_TABLET | Freq: Every day | ORAL | 0 refills | Status: DC

## 2021-11-18 MED ORDER — LEVOCETIRIZINE DIHYDROCHLORIDE 5 MG PO TABS
5.0000 mg | ORAL_TABLET | Freq: Every evening | ORAL | 0 refills | Status: DC
Start: 2021-11-18 — End: 2023-07-17

## 2021-11-18 MED ORDER — PREDNISONE 50 MG PO TABS: 50.0000 mg | ORAL_TABLET | Freq: Every day | ORAL | 0 refills | Status: DC

## 2021-11-18 MED ORDER — METHYLPREDNISOLONE SODIUM SUCC 125 MG IJ SOLR
125.0000 mg | Freq: Once | INTRAMUSCULAR | Status: AC
  Administered 2021-11-18: 125 mg via INTRAMUSCULAR

## 2021-11-18 MED ORDER — PROMETHAZINE-DM 6.25-15 MG/5ML PO SYRP: 5.0000 mL | ORAL_SOLUTION | Freq: Three times a day (TID) | ORAL | 0 refills | Status: DC | PRN

## 2021-11-18 MED ORDER — ALBUTEROL SULFATE HFA 108 (90 BASE) MCG/ACT IN AERS
1.0000 | INHALATION_SPRAY | Freq: Four times a day (QID) | RESPIRATORY_TRACT | 0 refills | Status: DC | PRN
Start: 2021-11-18 — End: 2022-10-08

## 2021-11-18 NOTE — ED Triage Notes (Signed)
Pt reports he has had SHOB since Sunday. Pt reports increased SHOB with walking. Pt rep[orts he took OTC meds and has not improved. ?

## 2021-11-18 NOTE — ED Provider Notes (Signed)
?[facility] ? ? ?MRN: [id number] DOB: Jan 06, 1971 ? ?Subjective:  ? ?Jordan Ewing is a 51 y.o. male presenting for 1 week history of persistent and worsening shortness of breath, wheezing, dyspnea, productive cough, sinus congestion. Has longstanding history of smoking marijuana since he was 51 years old.  Has used multiple over-the-counter medications with minimal relief.  Denies history of having to take high blood pressure medications but has been told multiple times that he has this.  Has not establish care with anybody in particular.  No history of heart failure. ? ?No current facility-administered medications for this encounter. ? ?Current Outpatient Medications:  ?  acetaminophen (TYLENOL) 325 MG tablet, Take 2 tablets (650 mg total) by mouth every 6 (six) hours as needed., Disp:  , Rfl:   ? ?No Known Allergies ? ?History reviewed. No pertinent past medical history.  ? ?Past Surgical History:  ?Procedure Laterality Date  ? LEG SURGERY    ? ? ?Family History  ?Problem Relation Age of Onset  ? Healthy Mother   ? Healthy Father   ? ? ?Social History  ? ?Tobacco Use  ? Smoking status: Every Day  ?  Types: Cigars  ? Smokeless tobacco: Never  ? Tobacco comments:  ?  black and milds  ?Vaping Use  ? Vaping Use: Never used  ?Substance Use Topics  ? Alcohol use: Yes  ?  Alcohol/week: 1.0 standard drink  ?  Types: 1 Shots of liquor per week  ? Drug use: Not Currently  ?  Types: Marijuana  ? ? ?ROS ? ? ?Objective:  ? ?Vitals: ?BP (!) 180/113   Pulse (!) 105   Temp 98.4 ?F (36.9 ?C)   Resp (!) 22   SpO2 97%  ? ?BP Readings from Last 3 Encounters:  ?11/18/21 (!) 152/94  ?07/13/21 (!) 152/87  ?04/07/20 (!) 158/107  ? ?Physical Exam ?Constitutional:   ?   General: He is not in acute distress. ?   Appearance: Normal appearance. He is well-developed and normal weight. He is not ill-appearing, toxic-appearing or diaphoretic.  ?HENT:  ?   Head: Normocephalic and atraumatic.  ?   Right Ear: Tympanic  membrane, ear canal and external ear normal. There is no impacted cerumen.  ?   Left Ear: Tympanic membrane, ear canal and external ear normal. There is no impacted cerumen.  ?   Nose: Congestion present. No rhinorrhea.  ?   Mouth/Throat:  ?   Mouth: Mucous membranes are moist.  ?   Pharynx: No oropharyngeal exudate or posterior oropharyngeal erythema.  ?Eyes:  ?   General: No scleral icterus.    ?   Right eye: No discharge.     ?   Left eye: No discharge.  ?   Extraocular Movements: Extraocular movements intact.  ?   Conjunctiva/sclera: Conjunctivae normal.  ?Cardiovascular:  ?   Rate and Rhythm: Normal rate and regular rhythm.  ?   Heart sounds: Normal heart sounds. No murmur heard. ?  No friction rub. No gallop.  ?Pulmonary:  ?   Effort: Pulmonary effort is normal. No respiratory distress.  ?   Breath sounds: No stridor. Wheezing present. No decreased breath sounds, rhonchi or rales.  ?Musculoskeletal:  ?   Cervical back: Normal range of motion and neck supple. No rigidity. No muscular tenderness.  ?Neurological:  ?   General: No focal deficit present.  ?   Mental Status: He is alert and oriented to person, place, and time.  ?Psychiatric:     ?  Mood and Affect: Mood normal.     ?   Behavior: Behavior normal.     ?   Thought Content: Thought content normal.  ? ? ?DG Chest 2 View ? ?Result Date: 11/18/2021 ?CLINICAL DATA:  Shortness of breath.  Elevated blood pressure. EXAM: CHEST - 2 VIEW COMPARISON:  02/28/2013 FINDINGS: The cardiomediastinal contours are normal. Central bronchial thickening. Pulmonary vasculature is normal. No consolidation, pleural effusion, or pneumothorax. No acute osseous abnormalities are seen. IMPRESSION: Central bronchial thickening suggesting bronchitis, smoking, or asthma. Electronically Signed   By: Narda Rutherford M.D.   On: 11/18/2021 18:56   ? ? ?Assessment and Plan :  ? ?PDMP not reviewed this encounter. ? ?1. Acute bronchitis, unspecified organism   ?2. Sinus congestion   ?3.  Marijuana abuse   ?4. Shortness of breath   ?5. Essential hypertension   ? ? ?IM Solumedrol at 125mg  in clinic.  Recommended starting an oral prednisone course tomorrow, use albuterol inhaler the rest of this week every 6 hours.  Then as needed.  Start Xyzal daily.  For his blood pressure, recommended a low-sodium diet, start losartan daily.  Establish care with a new PCP through [facility] primary care. Counseled patient on potential for adverse effects with medications prescribed/recommended today, ER and return-to-clinic precautions discussed, patient verbalized understanding. ? ?  ?[facility], PA-C ?11/18/21 1909 ? ?

## 2021-11-18 NOTE — Discharge Instructions (Addendum)

## 2022-10-08 ENCOUNTER — Ambulatory Visit
Admission: EM | Admit: 2022-10-08 | Discharge: 2022-10-08 | Disposition: A | Discharge status: Home / Self Care | Payer: BC Managed Care – PPO | Attending: Physician Assistant | Admitting: Physician Assistant

## 2022-10-08 DIAGNOSIS — T7840XA Allergy, unspecified, initial encounter: Secondary | ICD-10-CM

## 2022-10-08 DIAGNOSIS — R062 Wheezing: Secondary | ICD-10-CM

## 2022-10-08 DIAGNOSIS — J069 Acute upper respiratory infection, unspecified: Secondary | ICD-10-CM

## 2022-10-08 HISTORY — DX: Essential (primary) hypertension: I10

## 2022-10-08 HISTORY — DX: Other allergic rhinitis: J30.89

## 2022-10-08 MED ORDER — FLUTICASONE PROPIONATE 50 MCG/ACT NA SUSP: 2.0000 | Freq: Every day | NASAL | 0 refills | Status: DC

## 2022-10-08 MED ORDER — PREDNISONE 10 MG PO TABS: 10.0000 mg | ORAL_TABLET | Freq: Three times a day (TID) | ORAL | 0 refills | Status: DC

## 2022-10-08 MED ORDER — ALBUTEROL SULFATE HFA 108 (90 BASE) MCG/ACT IN AERS: 1.0000 | INHALATION_SPRAY | Freq: Four times a day (QID) | RESPIRATORY_TRACT | 0 refills | Status: AC | PRN

## 2022-10-08 MED ORDER — SPACER/AERO-HOLDING CHAMBERS DEVI: 1.0000 [IU] | Freq: Three times a day (TID) | 0 refills | Status: DC

## 2022-10-08 NOTE — ED Provider Notes (Signed)
[facility]    CSN: [id number] Arrival date & time: 10/08/22  1301      History   Chief Complaint Chief Complaint  Patient presents with   Cough    HPI Jordan Ewing is a 52 y.o. male.   52 year old male presents with cough, and congestion.  Patient indicates for the past week he has been having some upper respiratory sinus congestion maxillary and frontal sinuses, nasal congestion, postnasal drip and rhinitis with mainly clear production.  Patient also indicates having chest congestion, intermittent cough with coughing exacerbations, mild wheezing and shortness of breath associated.  Patient indicates that he uses an albuterol inhaler to decrease the wheezing but he indicates that he does not have an inhaler at the present time.  He is without fever, chills, nausea or vomiting.  He is tolerating fluids well.   Cough Associated symptoms: rhinorrhea and wheezing     Past Medical History:  Diagnosis Date   Environmental and seasonal allergies    Hypertension     There are no problems to display for this patient.   Past Surgical History:  Procedure Laterality Date   LEG SURGERY         Home Medications    Prior to Admission medications   Medication Sig Start Date End Date Taking? Authorizing Provider  fluticasone (FLONASE) 50 MCG/ACT nasal spray Place 2 sprays into both nostrils daily. 10/08/22  Yes Nyoka Lint, PA-C  predniSONE (DELTASONE) 10 MG tablet Take 1 tablet (10 mg total) by mouth in the morning, at noon, and at bedtime. 10/08/22  Yes Nyoka Lint, PA-C  Spacer/Aero-Holding Chambers DEVI 1 Units by Does not apply route in the morning, at noon, and at bedtime. 10/08/22  Yes Nyoka Lint, PA-C  acetaminophen (TYLENOL) 325 MG tablet Take 2 tablets (650 mg total) by mouth every 6 (six) hours as needed. 02/09/19   Lamptey, Myrene Galas, MD  albuterol (VENTOLIN HFA) 108 (90 Base) MCG/ACT inhaler Inhale 1-2 puffs into the lungs every 6 (six) hours as needed  for wheezing or shortness of breath. 10/08/22   Nyoka Lint, PA-C  levocetirizine (XYZAL) 5 MG tablet Take 1 tablet (5 mg total) by mouth every evening. 11/18/21   Jaynee Eagles, PA-C  losartan (COZAAR) 50 MG tablet Take 1 tablet (50 mg total) by mouth daily. 11/18/21   Jaynee Eagles, PA-C  predniSONE (DELTASONE) 50 MG tablet Take 1 tablet (50 mg total) by mouth daily with breakfast. 11/18/21   Jaynee Eagles, PA-C  promethazine-dextromethorphan (PROMETHAZINE-DM) 6.25-15 MG/5ML syrup Take 5 mLs by mouth 3 (three) times daily as needed for cough. 11/18/21   Jaynee Eagles, PA-C    Family History Family History  Problem Relation Age of Onset   Healthy Mother    Healthy Father     Social History Social History   Tobacco Use   Smoking status: Every Day    Types: Cigars   Smokeless tobacco: Never   Tobacco comments:    black and milds  Vaping Use   Vaping Use: Never used  Substance Use Topics   Alcohol use: Yes    Alcohol/week: 1.0 standard drink of alcohol    Types: 1 Shots of liquor per week   Drug use: Not Currently    Types: Marijuana     Allergies   Patient has no known allergies.   Review of Systems Review of Systems  HENT:  Positive for postnasal drip, rhinorrhea and sinus pain.   Respiratory:  Positive for cough and  wheezing.      Physical Exam Triage Vital Signs ED Triage Vitals  Enc Vitals Group     BP 10/08/22 1320 124/78     Pulse Rate 10/08/22 1320 (!) 104     Resp 10/08/22 1320 16     Temp 10/08/22 1320 98.3 F (36.8 C)     Temp Source 10/08/22 1320 Oral     SpO2 10/08/22 1320 96 %     Weight --      Height --      Head Circumference --      Peak Flow --      Pain Score 10/08/22 1321 0     Pain Loc --      Pain Edu? --      Excl. in [place]? --    No data found.  Updated Vital Signs BP 124/78 (BP Location: Left Arm)   Pulse (!) 104   Temp 98.3 F (36.8 C) (Oral)   Resp 16   SpO2 96%   Visual Acuity Right Eye Distance:   Left Eye Distance:    Bilateral Distance:    Right Eye Near:   Left Eye Near:    Bilateral Near:     Physical Exam Constitutional:      Appearance: Normal appearance.  HENT:     Right Ear: Tympanic membrane and ear canal normal.     Left Ear: Tympanic membrane and ear canal normal.     Mouth/Throat:     Mouth: Mucous membranes are moist.     Pharynx: Oropharynx is clear.  Cardiovascular:     Rate and Rhythm: Normal rate and regular rhythm.     Heart sounds: Normal heart sounds.  Pulmonary:     Effort: Pulmonary effort is normal.     Breath sounds: Normal breath sounds and air entry. No wheezing, rhonchi or rales.  Lymphadenopathy:     Cervical: No cervical adenopathy.  Neurological:     Mental Status: He is alert.      UC Treatments / Results  Labs (all labs ordered are listed, but only abnormal results are displayed) Labs Reviewed - No data to display  EKG   Radiology No results found.  Procedures Procedures (including critical care time)  Medications Ordered in UC Medications - No data to display  Initial Impression / Assessment and Plan / UC Course  I have reviewed the triage vital signs and the nursing notes.  Pertinent labs & imaging results that were available during my care of the patient were reviewed by me and considered in my medical decision making (see chart for details).    Plan: The diagnosis be treated with the following: 1.  Upper respiratory infection: A.  Advised to use Flonase nasal spray, 2 sprays each nostril once a day to help decrease sinus congestion and drainage. 2.  Allergies: A.  Advised to prednisone 10 mg 3 times a day for 5 days only to help control allergy symptoms and inflammatory response. 3.  Wheezing: A.  Advise use albuterol inhaler with spacer, 2 puffs every 6 hours on a regular basis to help control wheezing. 4.  Advised to follow-up PCP return to urgent care as needed. Final Clinical Impressions(s) / UC Diagnoses   Final diagnoses:   Acute upper respiratory infection  Allergy, initial encounter  Wheezing     Discharge Instructions      Advised to use the albuterol inhaler with spacer, 2 puffs every 6 hours as needed for cough, congestion and  wheezing. Advised use of Flonase nasal spray, 2 sprays each nostril once daily to help open up the sinuses and upper respiratory passageways. Advised take prednisone 10 mg 3 times a day for 5 days only to help reduce the allergy and respiratory inflammatory response.  Advised follow-up PCP return to urgent care if symptoms fail to improve.    ED Prescriptions     Medication Sig Dispense Auth. Provider   albuterol (VENTOLIN HFA) 108 (90 Base) MCG/ACT inhaler Inhale 1-2 puffs into the lungs every 6 (six) hours as needed for wheezing or shortness of breath. 18 g Nyoka Lint, PA-C   Spacer/Aero-Holding Chambers DEVI 1 Units by Does not apply route in the morning, at noon, and at bedtime. 1 Units Nyoka Lint, PA-C   fluticasone [facility]) 50 MCG/ACT nasal spray Place 2 sprays into both nostrils daily. 9.9 mL Nyoka Lint, PA-C   predniSONE (DELTASONE) 10 MG tablet Take 1 tablet (10 mg total) by mouth in the morning, at noon, and at bedtime. 15 tablet Nyoka Lint, PA-C      PDMP not reviewed this encounter.   Nyoka Lint, PA-C 10/08/22 1349

## 2022-10-08 NOTE — ED Triage Notes (Signed)
Pt states cough,congestion and and stuffy nose for the past week. States he was wheezing and needs a new inhaler.

## 2022-10-08 NOTE — Discharge Instructions (Signed)
Advised to use the albuterol inhaler with spacer, 2 puffs every 6 hours as needed for cough, congestion and wheezing. Advised use of Flonase nasal spray, 2 sprays each nostril once daily to help open up the sinuses and upper respiratory passageways. Advised take prednisone 10 mg 3 times a day for 5 days only to help reduce the allergy and respiratory inflammatory response.  Advised follow-up PCP return to urgent care if symptoms fail to improve.

## 2022-11-19 ENCOUNTER — Ambulatory Visit
Admission: EM | Admit: 2022-11-19 | Discharge: 2022-11-19 | Disposition: A | Discharge status: Home / Self Care | Payer: Self-pay | Attending: Internal Medicine | Admitting: Internal Medicine

## 2022-11-19 DIAGNOSIS — Z113 Encounter for screening for infections with a predominantly sexual mode of transmission: Secondary | ICD-10-CM | POA: Insufficient documentation

## 2022-11-19 DIAGNOSIS — K047 Periapical abscess without sinus: Secondary | ICD-10-CM | POA: Insufficient documentation

## 2022-11-19 DIAGNOSIS — Z202 Contact with and (suspected) exposure to infections with a predominantly sexual mode of transmission: Secondary | ICD-10-CM | POA: Insufficient documentation

## 2022-11-19 MED ORDER — AMOXICILLIN-POT CLAVULANATE 875-125 MG PO TABS: 1.0000 | ORAL_TABLET | Freq: Two times a day (BID) | ORAL | 0 refills | Status: DC

## 2022-11-19 MED ORDER — METRONIDAZOLE 500 MG PO TABS: 2000.0000 mg | ORAL_TABLET | Freq: Once | ORAL | 0 refills | Status: AC

## 2022-11-19 MED ORDER — METRONIDAZOLE 500 MG PO TABS: 2000.0000 mg | ORAL_TABLET | Freq: Once | ORAL | Status: DC

## 2022-11-19 NOTE — ED Triage Notes (Addendum)
Pt presents to uc with co of recent tric exposure. No symptoms.   Pt also reports dental pain on right upper side lost a tooth and noticed blood and pus drainage from the inside today.

## 2022-11-19 NOTE — Discharge Instructions (Addendum)
Antibiotic has been sent to the pharmacy due to trichomonas exposure.  Do not drink alcohol with this medication.  Penile swab is pending.  You also have a dental infection which is being treated with an antibiotic that has been sent to the pharmacy.  Take this medication with food to avoid stomach upset.  Follow-up if any symptoms persist or worsen.  Follow-up with dentist for dental abscess.

## 2022-11-19 NOTE — ED Provider Notes (Signed)
[facility]    CSN: [id number] Arrival date & time: 11/19/22  1711      History   Chief Complaint Chief Complaint  Patient presents with   Dental Problem   sti check     HPI Jordan Ewing is a 52 y.o. male.   Patient presents with 2 different chief complaints today.  Patient reports possible dental abscess to the right upper mouth that he noticed over the past few days.  Reports that it was very swollen.  He states that he popped it and drained out purulent drainage.  He denies any associated fever or trauma to the mouth.  Patient also reports exposure to trichomonas from recent sexual partner who tested positive.  He has had unprotected intercourse with his partner. He denies any associated symptoms.     Past Medical History:  Diagnosis Date   Environmental and seasonal allergies    Hypertension     There are no problems to display for this patient.   Past Surgical History:  Procedure Laterality Date   LEG SURGERY         Home Medications    Prior to Admission medications   Medication Sig Start Date End Date Taking? Authorizing Provider  amoxicillin-clavulanate (AUGMENTIN) 875-125 MG tablet Take 1 tablet by mouth every 12 (twelve) hours. 11/19/22  Yes Kerryn Tennant, Rolly Salter E, FNP  metroNIDAZOLE (FLAGYL) 500 MG tablet Take 4 tablets (2,000 mg total) by mouth once for 1 dose. 11/19/22 11/19/22 Yes Malek Skog, Acie Fredrickson, FNP  acetaminophen (TYLENOL) 325 MG tablet Take 2 tablets (650 mg total) by mouth every 6 (six) hours as needed. 02/09/19   Lamptey, Britta Mccreedy, MD  albuterol (VENTOLIN HFA) 108 (90 Base) MCG/ACT inhaler Inhale 1-2 puffs into the lungs every 6 (six) hours as needed for wheezing or shortness of breath. 10/08/22   Ellsworth Lennox, PA-C  fluticasone [facility]) 50 MCG/ACT nasal spray Place 2 sprays into both nostrils daily. 10/08/22   Ellsworth Lennox, PA-C  levocetirizine (XYZAL) 5 MG tablet Take 1 tablet (5 mg total) by mouth every evening. 11/18/21   Wallis Bamberg,  PA-C  losartan (COZAAR) 50 MG tablet Take 1 tablet (50 mg total) by mouth daily. 11/18/21   Wallis Bamberg, PA-C  predniSONE (DELTASONE) 10 MG tablet Take 1 tablet (10 mg total) by mouth in the morning, at noon, and at bedtime. 10/08/22   Ellsworth Lennox, PA-C  predniSONE (DELTASONE) 50 MG tablet Take 1 tablet (50 mg total) by mouth daily with breakfast. 11/18/21   Wallis Bamberg, PA-C  promethazine-dextromethorphan (PROMETHAZINE-DM) 6.25-15 MG/5ML syrup Take 5 mLs by mouth 3 (three) times daily as needed for cough. 11/18/21   Wallis Bamberg, PA-C  Spacer/Aero-Holding Chambers DEVI 1 Units by Does not apply route in the morning, at noon, and at bedtime. 10/08/22   Ellsworth Lennox, PA-C    Family History Family History  Problem Relation Age of Onset   Healthy Mother    Healthy Father     Social History Social History   Tobacco Use   Smoking status: Every Day    Types: Cigars   Smokeless tobacco: Never   Tobacco comments:    black and milds  Vaping Use   Vaping Use: Never used  Substance Use Topics   Alcohol use: Yes    Alcohol/week: 1.0 standard drink of alcohol    Types: 1 Shots of liquor per week   Drug use: Not Currently    Types: Marijuana     Allergies  Patient has no known allergies.   Review of Systems Review of Systems Per HPI  Physical Exam Triage Vital Signs ED Triage Vitals  Enc Vitals Group     BP 11/19/22 1755 (!) 158/93     Pulse Rate 11/19/22 1755 89     Resp 11/19/22 1755 19     Temp 11/19/22 1755 97.8 F (36.6 C)     Temp Source 11/19/22 1810 Oral     SpO2 11/19/22 1755 98 %     Weight --      Height --      Head Circumference --      Peak Flow --      Pain Score 11/19/22 1753 0     Pain Loc --      Pain Edu? --      Excl. in [place]? --    No data found.  Updated Vital Signs BP 109/72 (BP Location: Right Arm)   Pulse 72   Temp 97.9 F (36.6 C) (Oral)   Resp 20   SpO2 97%   Visual Acuity Right Eye Distance:   Left Eye Distance:   Bilateral  Distance:    Right Eye Near:   Left Eye Near:    Bilateral Near:     Physical Exam Constitutional:      General: He is not in acute distress.    Appearance: Normal appearance. He is not toxic-appearing or diaphoretic.  HENT:     Head: Normocephalic and atraumatic.     Mouth/Throat:     Comments: Has drained abscess that has surrounding mild swelling and erythema present to right upper outer gingiva.  No drainage noted at this time. Eyes:     Extraocular Movements: Extraocular movements intact.     Conjunctiva/sclera: Conjunctivae normal.  Pulmonary:     Effort: Pulmonary effort is normal.  Genitourinary:    Comments: Deferred with shared decision making.  Self swab performed. Neurological:     General: No focal deficit present.     Mental Status: He is alert and oriented to person, place, and time. Mental status is at baseline.  Psychiatric:        Mood and Affect: Mood normal.        Behavior: Behavior normal.        Thought Content: Thought content normal.        Judgment: Judgment normal.      UC Treatments / Results  Labs (all labs ordered are listed, but only abnormal results are displayed) Labs Reviewed  CYTOLOGY, (ORAL, ANAL, URETHRAL) ANCILLARY ONLY    EKG   Radiology No results found.  Procedures Procedures (including critical care time)  Medications Ordered in UC Medications - No data to display  Initial Impression / Assessment and Plan / UC Course  I have reviewed the triage vital signs and the nursing notes.  Pertinent labs & imaging results that were available during my care of the patient were reviewed by me and considered in my medical decision making (see chart for details).     1.  Dental infection  It appears the patient had an abscess that is now drained.  Will treat with Augmentin antibiotic.  Patient advised to follow-up with dentist for further evaluation and management of this.  2.  STD exposure  Given confirmed exposure to  trichomonas infection, will opt to treat with 2 g of Flagyl for 1 dose.  Do not have medication here available in urgent care so the medication was sent  to pharmacy for patient to take.  Advised to refrain from sexual activity until test results and treatment are complete.  Cytology swab pending.  Discussed safe sex practices.  Patient verbalized understanding and was agreeable with plan. Final Clinical Impressions(s) / UC Diagnoses   Final diagnoses:  Trichomonas exposure  Screening examination for venereal disease  Dental infection     Discharge Instructions      Antibiotic has been sent to the pharmacy due to trichomonas exposure.  Do not drink alcohol with this medication.  Penile swab is pending.  You also have a dental infection which is being treated with an antibiotic that has been sent to the pharmacy.  Take this medication with food to avoid stomach upset.  Follow-up if any symptoms persist or worsen.  Follow-up with dentist for dental abscess.    ED Prescriptions     Medication Sig Dispense Auth. Provider   amoxicillin-clavulanate (AUGMENTIN) 875-125 MG tablet Take 1 tablet by mouth every 12 (twelve) hours. 14 tablet [place], [place], [place]   metroNIDAZOLE (FLAGYL) 500 MG tablet Take 4 tablets (2,000 mg total) by mouth once for 1 dose. 4 tablet [place], Acie Fredrickson, [place]      PDMP not reviewed this encounter.   Gustavus Bryant, [place] 11/19/22 [phone]

## 2022-11-23 LAB — CYTOLOGY, (ORAL, ANAL, URETHRAL) ANCILLARY ONLY
Chlamydia: NEGATIVE
Comment: NEGATIVE
Comment: NEGATIVE
Comment: NORMAL
Neisseria Gonorrhea: NEGATIVE
Trichomonas: POSITIVE — AB

## 2022-12-22 ENCOUNTER — Ambulatory Visit: Payer: Self-pay | Admitting: Family Medicine

## 2023-02-20 IMAGING — CT CT RENAL STONE PROTOCOL
2 of 4 series · 16 of 46 positions shown, 18 images · non-contrast
Comparison: None.

CLINICAL DATA: Right abdominal and flank pain.

EXAM:
CT ABDOMEN AND PELVIS WITHOUT CONTRAST
TECHNIQUE: Multidetector CT imaging of the abdomen and pelvis was performed
following the standard protocol without IV contrast.

[Series 3: renal stone 5.0 · axial · 0.94mm/px · z∈[-491,-51]mm · 13 of 96 slices shown, 15 images]
[im 4/96  soft-tissue]
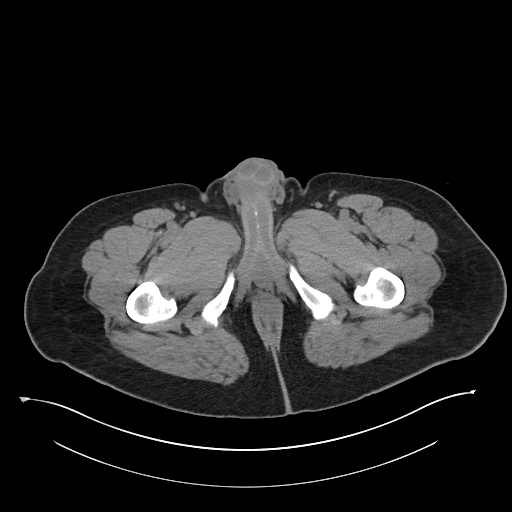
[im 4/96  bone]
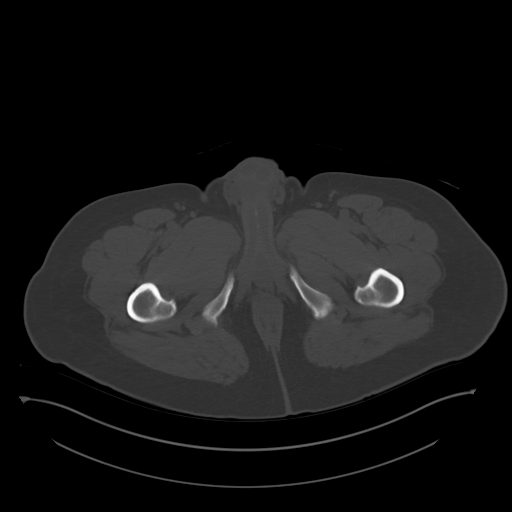
[im 12/96  soft-tissue]
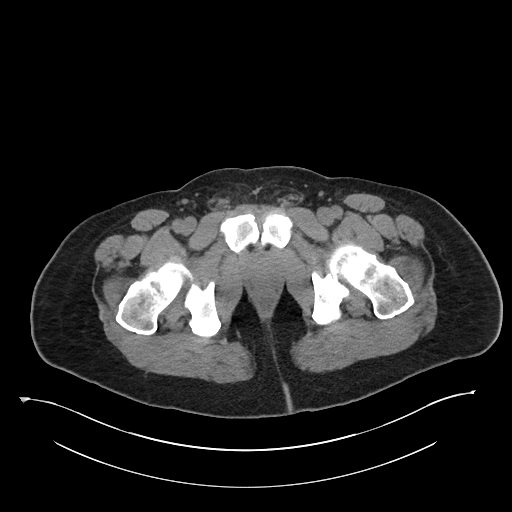
[im 20/96  soft-tissue]
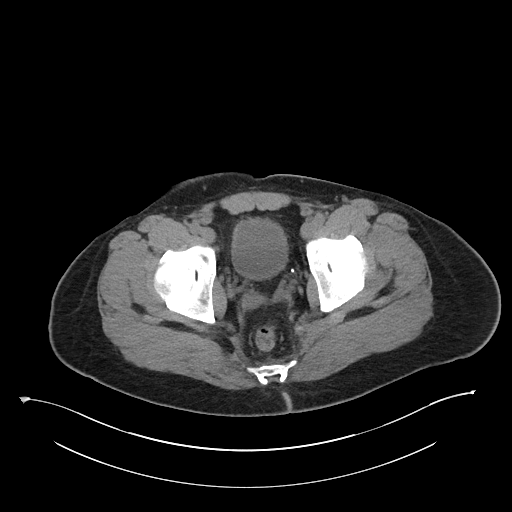
[im 27/96  soft-tissue]
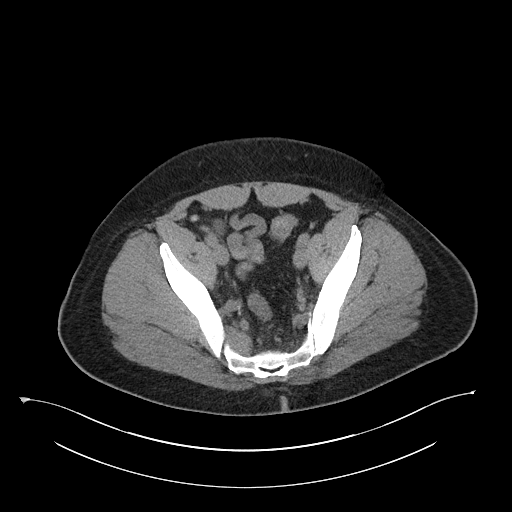
[im 35/96  soft-tissue]
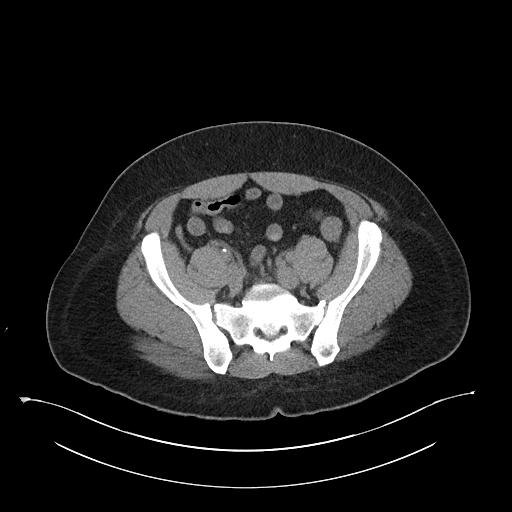
[im 42/96  soft-tissue]
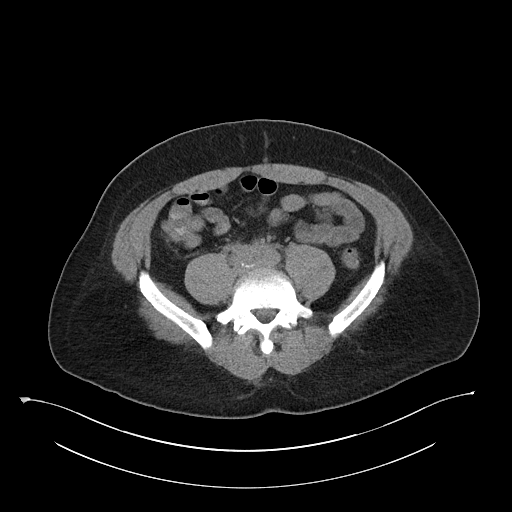
[im 50/96  soft-tissue]
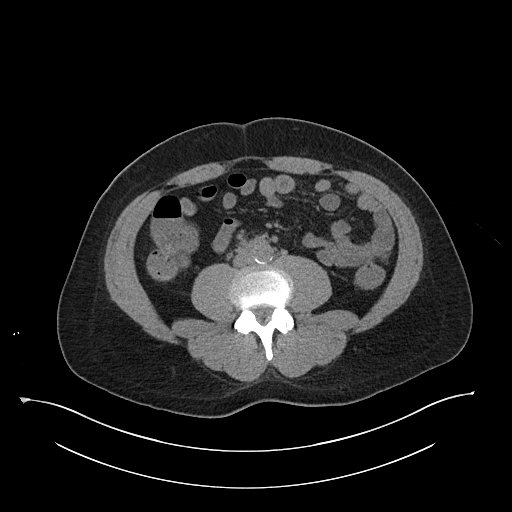
[im 54/96  soft-tissue]
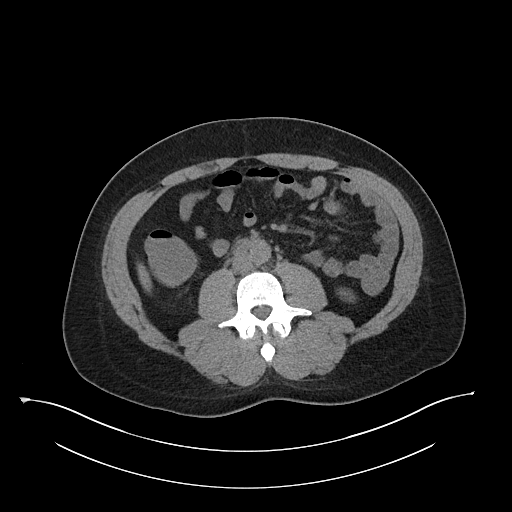
[im 61/96  soft-tissue]
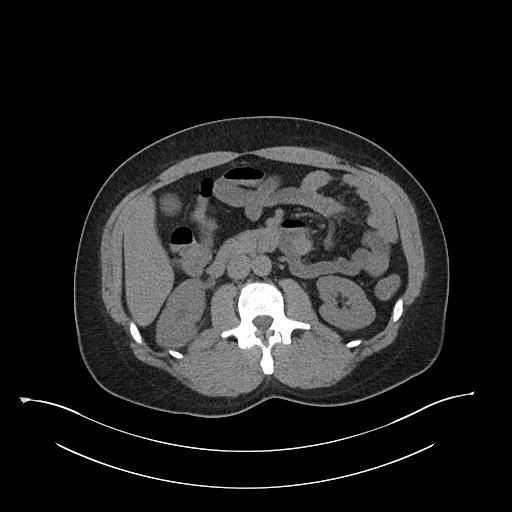
[im 61/96  bone]
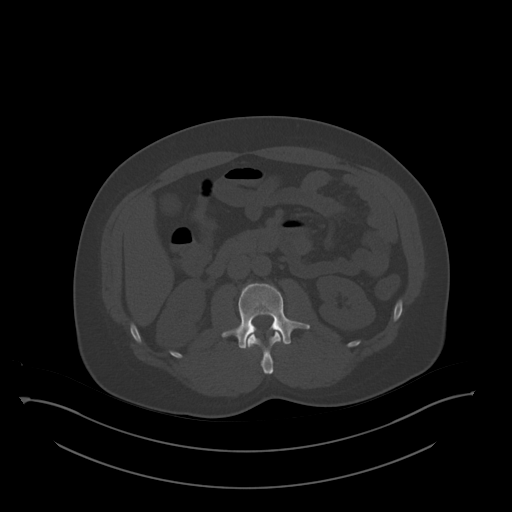
[im 69/96  soft-tissue]
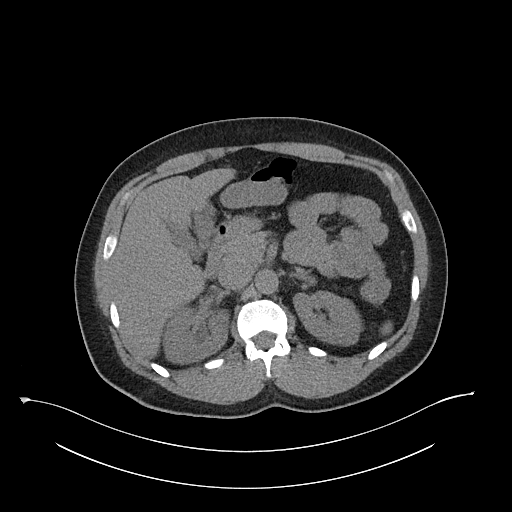
[im 77/96  soft-tissue]
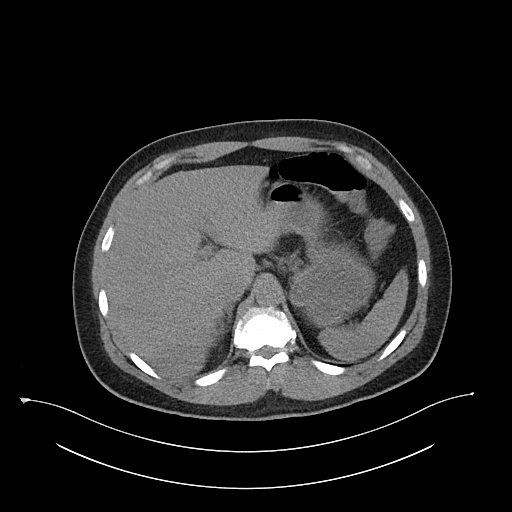
[im 84/96  soft-tissue]
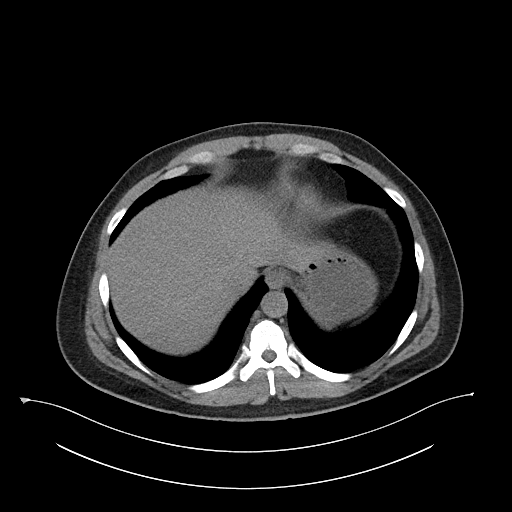
[im 92/96  soft-tissue]
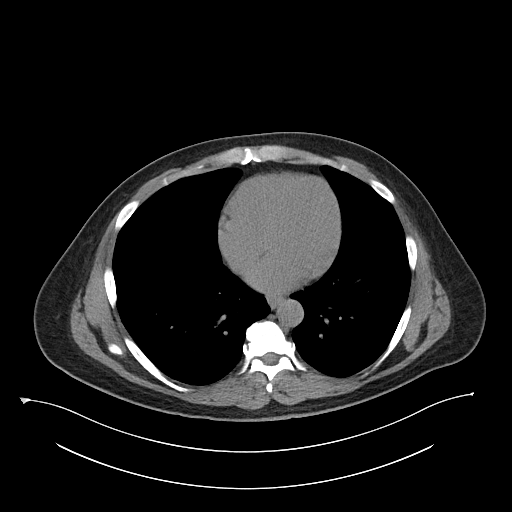

[Series 6: cor · coronal · 0.91mm/px · 3 of 139 slices shown]
[im 47/139  soft-tissue]
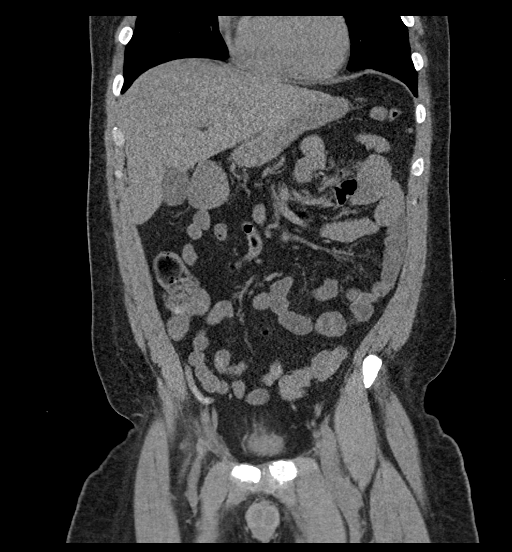
[im 62/139  soft-tissue]
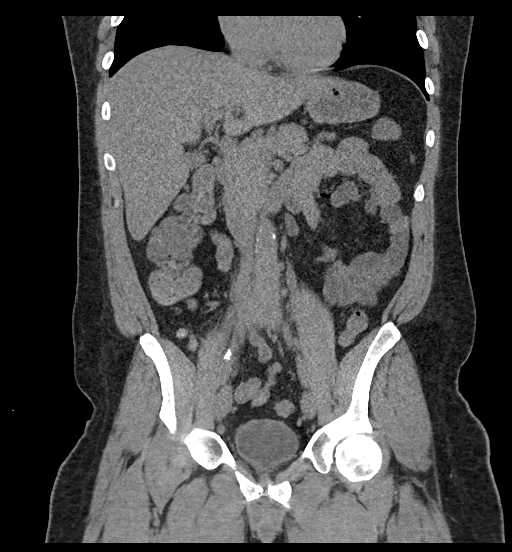
[im 77/139  soft-tissue]
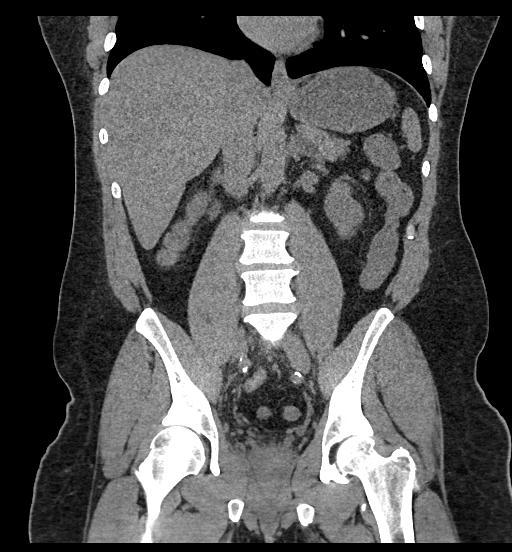

[16 of 46 positions shown; findings below may reference images not displayed]

FINDINGS: Lower chest: No acute abnormality.

Hepatobiliary: No focal liver abnormality is seen. No gallstones,
gallbladder wall thickening, or biliary dilatation.

Pancreas: Unremarkable. No pancreatic ductal dilatation or
surrounding inflammatory changes.

Spleen: Normal in size without focal abnormality.

Adrenals/Urinary Tract: There is mild right-sided
hydroureteronephrosis to the level of the pelvic inlet. No urinary
tract calculi are identified. Otherwise, the kidneys, adrenal glands
and bladder are within normal limits.

Stomach/Bowel: Stomach is within normal limits. Appendix appears
normal. No evidence of bowel wall thickening, distention, or
inflammatory changes.

Vascular/Lymphatic: Aortic atherosclerosis. The aorta and IVC are
normal in size. There are atherosclerotic calcifications of the
aorta. There is likely abnormal soft tissue in the retroperitoneum
at the level of the aortic bifurcation with loss of fat plane
surrounding the aorta, aortic bifurcation, and adjacent venous
structures. This measures up to proximally 2.3 cm in thickness in AP
dimension. No other discrete separate enlarged lymph nodes are
identified.

Reproductive: Prostate is unremarkable.

Other: No abdominal wall hernia or abnormality. No abdominopelvic
ascites.

Musculoskeletal: There is a small fat containing umbilical hernia.
No evidence for fracture. No focal osseous lesion.
IMPRESSION: 1. Findings concerning for abnormal retroperitoneal soft tissue at
the level of the aortic bifurcation measuring up to 2.3 cm in AP
dimension. Differential diagnosis includes retroperitoneal fibrosis
and other retroperitoneal neoplasm such as sarcoma.
2. Mild right-sided hydronephrosis likely due to obstruction of the
right ureter at the level of the retroperitoneal process. No urinary
tract calculus identified.

## 2023-03-03 ENCOUNTER — Encounter: Payer: Self-pay | Admitting: Family

## 2023-03-03 ENCOUNTER — Ambulatory Visit (INDEPENDENT_AMBULATORY_CARE_PROVIDER_SITE_OTHER): Payer: Self-pay | Admitting: Family

## 2023-03-03 VITALS — BP 143/81 | HR 79 | Temp 98.3°F | Ht 67.5 in | Wt 202.4 lb

## 2023-03-03 DIAGNOSIS — Z131 Encounter for screening for diabetes mellitus: Secondary | ICD-10-CM

## 2023-03-03 DIAGNOSIS — Z1159 Encounter for screening for other viral diseases: Secondary | ICD-10-CM

## 2023-03-03 DIAGNOSIS — Z0000 Encounter for general adult medical examination without abnormal findings: Secondary | ICD-10-CM

## 2023-03-03 DIAGNOSIS — Z1329 Encounter for screening for other suspected endocrine disorder: Secondary | ICD-10-CM

## 2023-03-03 DIAGNOSIS — Z7689 Persons encountering health services in other specified circumstances: Secondary | ICD-10-CM

## 2023-03-03 DIAGNOSIS — Z13228 Encounter for screening for other metabolic disorders: Secondary | ICD-10-CM

## 2023-03-03 DIAGNOSIS — M21612 Bunion of left foot: Secondary | ICD-10-CM

## 2023-03-03 DIAGNOSIS — Z130 Encounter for screening for diseases of the blood and blood-forming organs and certain disorders involving the immune mechanism: Secondary | ICD-10-CM

## 2023-03-03 DIAGNOSIS — Z202 Contact with and (suspected) exposure to infections with a predominantly sexual mode of transmission: Secondary | ICD-10-CM

## 2023-03-03 DIAGNOSIS — Z1211 Encounter for screening for malignant neoplasm of colon: Secondary | ICD-10-CM

## 2023-03-03 DIAGNOSIS — Z114 Encounter for screening for human immunodeficiency virus [HIV]: Secondary | ICD-10-CM

## 2023-03-03 DIAGNOSIS — I10 Essential (primary) hypertension: Secondary | ICD-10-CM

## 2023-03-03 DIAGNOSIS — M21611 Bunion of right foot: Secondary | ICD-10-CM

## 2023-03-03 DIAGNOSIS — Z13220 Encounter for screening for lipoid disorders: Secondary | ICD-10-CM

## 2023-03-03 MED ORDER — HYDROCHLOROTHIAZIDE 12.5 MG PO TABS: 12.5000 mg | ORAL_TABLET | Freq: Every day | ORAL | 1 refills | Status: DC

## 2023-03-03 NOTE — Progress Notes (Signed)
Subjective:    Jordan Ewing - 52 y.o. male MRN [id number]  Date of birth: 1971-07-23  HPI  Jordan Ewing is to establish care and annual physical exam.  Current issues and/or concerns: - History of hypertension. Reports in the past he was prescribed Losartan but never took it. States "I heard Losartan messes with erection". He does not check blood pressure outside of office. He limits salt intake. He exercises routinely. He does not complain of red flag symptoms such as but not limited to chest pain, shortness of breath, worst headache of life, nausea/vomiting.  - Requests referral to foot doctor for evaluation of bilateral feet bunions.  - Reports recent exposure to trichomoniasis. He denies associated symptoms. - No further issues/concerns for discussion today.     ROS per HPI     Health Maintenance:  Health Maintenance Due  Topic Date Due   HIV Screening  Never done   Hepatitis C Screening  Never done   DTaP/Tdap/Td (1 - Tdap) Never done   Colonoscopy  Never done   Zoster Vaccines- Shingrix (1 of 2) Never done   COVID-19 Vaccine (1 - 2023-24 season) Never done   INFLUENZA VACCINE  02/25/2023     Past Medical History: There are no problems to display for this patient.   Social History   reports that he has been smoking cigars. He has never used smokeless tobacco. He reports current alcohol use of about 1.0 standard drink of alcohol per week. He reports that he does not currently use drugs after having used the following drugs: Marijuana.   Family History  family history includes Healthy in his father and mother.   Medications: reviewed and updated   Objective:   Physical Exam BP (!) 143/81   Pulse 79   Temp 98.3 F (36.8 C) (Oral)   Ht 5' 7.5" (1.715 m)   Wt 202 lb 6.4 oz (91.8 kg)   SpO2 94%   BMI 31.23 kg/m   Physical Exam HENT:     Head: Normocephalic and atraumatic.     Right Ear: Tympanic membrane, ear canal and external ear normal.     Left  Ear: Tympanic membrane, ear canal and external ear normal.     Nose: Nose normal.     Mouth/Throat:     Mouth: Mucous membranes are moist.     Pharynx: Oropharynx is clear.  Eyes:     Extraocular Movements: Extraocular movements intact.     Conjunctiva/sclera: Conjunctivae normal.     Pupils: Pupils are equal, round, and reactive to light.  Cardiovascular:     Rate and Rhythm: Normal rate and regular rhythm.     Pulses: Normal pulses.     Heart sounds: Normal heart sounds.  Pulmonary:     Effort: Pulmonary effort is normal.     Breath sounds: Normal breath sounds.  Abdominal:     General: Bowel sounds are normal.     Palpations: Abdomen is soft.  Genitourinary:    Comments: Patient declined.  Musculoskeletal:        General: Normal range of motion.     Right shoulder: Normal.     Left shoulder: Normal.     Right upper arm: Normal.     Left upper arm: Normal.     Right elbow: Normal.     Left elbow: Normal.     Right forearm: Normal.     Left forearm: Normal.     Right wrist: Normal.  Left wrist: Normal.     Right hand: Normal.     Left hand: Normal.     Cervical back: Normal, normal range of motion and neck supple.     Thoracic back: Normal.     Lumbar back: Normal.     Right hip: Normal.     Left hip: Normal.     Right upper leg: Normal.     Left upper leg: Normal.     Right knee: Normal.     Left knee: Normal.     Right lower leg: Normal.     Left lower leg: Normal.     Right ankle: Normal.     Left ankle: Normal.     Right foot: Bunion present.     Left foot: Bunion present.  Skin:    General: Skin is warm and dry.     Capillary Refill: Capillary refill takes less than 2 seconds.  Neurological:     General: No focal deficit present.     Mental Status: He is alert and oriented to person, place, and time.  Psychiatric:        Mood and Affect: Mood normal.        Behavior: Behavior normal.        Assessment & Plan:  1. Encounter to establish care 2.  Annual physical exam - Counseled on 150 minutes of exercise per week as tolerated, healthy eating (including decreased daily intake of saturated fats, cholesterol, added sugars, sodium), STI prevention, and routine healthcare maintenance.  3. Screening for metabolic disorder - Routine screening.  - CMP14+EGFR  4. Screening for deficiency anemia - Routine screening.  - CBC  5. Diabetes mellitus screening - Routine screening.  - Hemoglobin A1c  6. Screening cholesterol level - Routine screening.  - Lipid panel  7. Thyroid disorder screen - Routine screening.  - TSH  8. Colon cancer screening - Routine screening.  - Ambulatory referral to Gastroenterology  9. Exposure to STD - Routine screening.  - Urine cytology ancillary only  10. Encounter for screening for HIV - Routine screening.  - HIV antibody (with reflex)  11. Need for hepatitis C screening test - Routine screening.  - Hepatitis C Antibody  12. Primary hypertension - Blood pressure not at goal during today's visit. Patient asymptomatic without chest pressure, chest pain, palpitations, shortness of breath, worst headache of life, and any additional red flag symptoms. - Hydrochlorothiazide as prescribed.  - Counseled on blood pressure goal of less than 130/80, low-sodium, DASH diet, medication compliance, and 150 minutes of moderate intensity exercise per week as tolerated. Counseled on medication adherence and adverse effects. - Follow-up with primary provider in 2 weeks or sooner if needed for blood pressure check. - hydrochlorothiazide (HYDRODIURIL) 12.5 MG tablet; Take 1 tablet (12.5 mg total) by mouth daily.  Dispense: 30 tablet; Refill: 1  13. Bunion of great toe of left foot 14. Bunion of great toe of right foot - Referral to Podiatry for further evaluation/management.  - Ambulatory referral to Podiatry   Patient was given clear instructions to go to Emergency Department or return to medical center if  symptoms don't improve, worsen, or new problems develop.The patient verbalized understanding.  I discussed the assessment and treatment plan with the patient. The patient was provided an opportunity to ask questions and all were answered. The patient agreed with the plan and demonstrated an understanding of the instructions.   The patient was advised to call back or seek an in-person evaluation  if the symptoms worsen or if the condition fails to improve as anticipated.    Ricky Stabs, NP 03/03/2023, 3:46 PM Primary Care at [facility]

## 2023-03-03 NOTE — Progress Notes (Signed)
Pt is asking for a physical.

## 2023-03-03 NOTE — Patient Instructions (Signed)

## 2023-03-04 ENCOUNTER — Other Ambulatory Visit: Payer: Self-pay | Admitting: Family

## 2023-03-04 ENCOUNTER — Encounter: Payer: Self-pay | Admitting: Family

## 2023-03-04 DIAGNOSIS — E785 Hyperlipidemia, unspecified: Secondary | ICD-10-CM

## 2023-03-04 DIAGNOSIS — R7303 Prediabetes: Secondary | ICD-10-CM | POA: Insufficient documentation

## 2023-03-04 MED ORDER — ATORVASTATIN CALCIUM 20 MG PO TABS: 20.0000 mg | ORAL_TABLET | Freq: Every day | ORAL | 0 refills | Status: DC

## 2023-03-19 ENCOUNTER — Encounter: Payer: Self-pay | Admitting: Family

## 2023-03-19 NOTE — Progress Notes (Signed)
Erroneous encounter-disregard

## 2023-03-23 ENCOUNTER — Ambulatory Visit: Payer: Self-pay | Admitting: Family

## 2023-04-06 ENCOUNTER — Ambulatory Visit: Payer: Self-pay | Admitting: Family

## 2023-06-29 IMAGING — DX DG CHEST 2V
2 series · 2 of 2 positions shown · non-contrast
Comparison: 02/28/2013

CLINICAL DATA: Shortness of breath.  Elevated blood pressure.

EXAM:
CHEST - 2 VIEW

[chest pa]
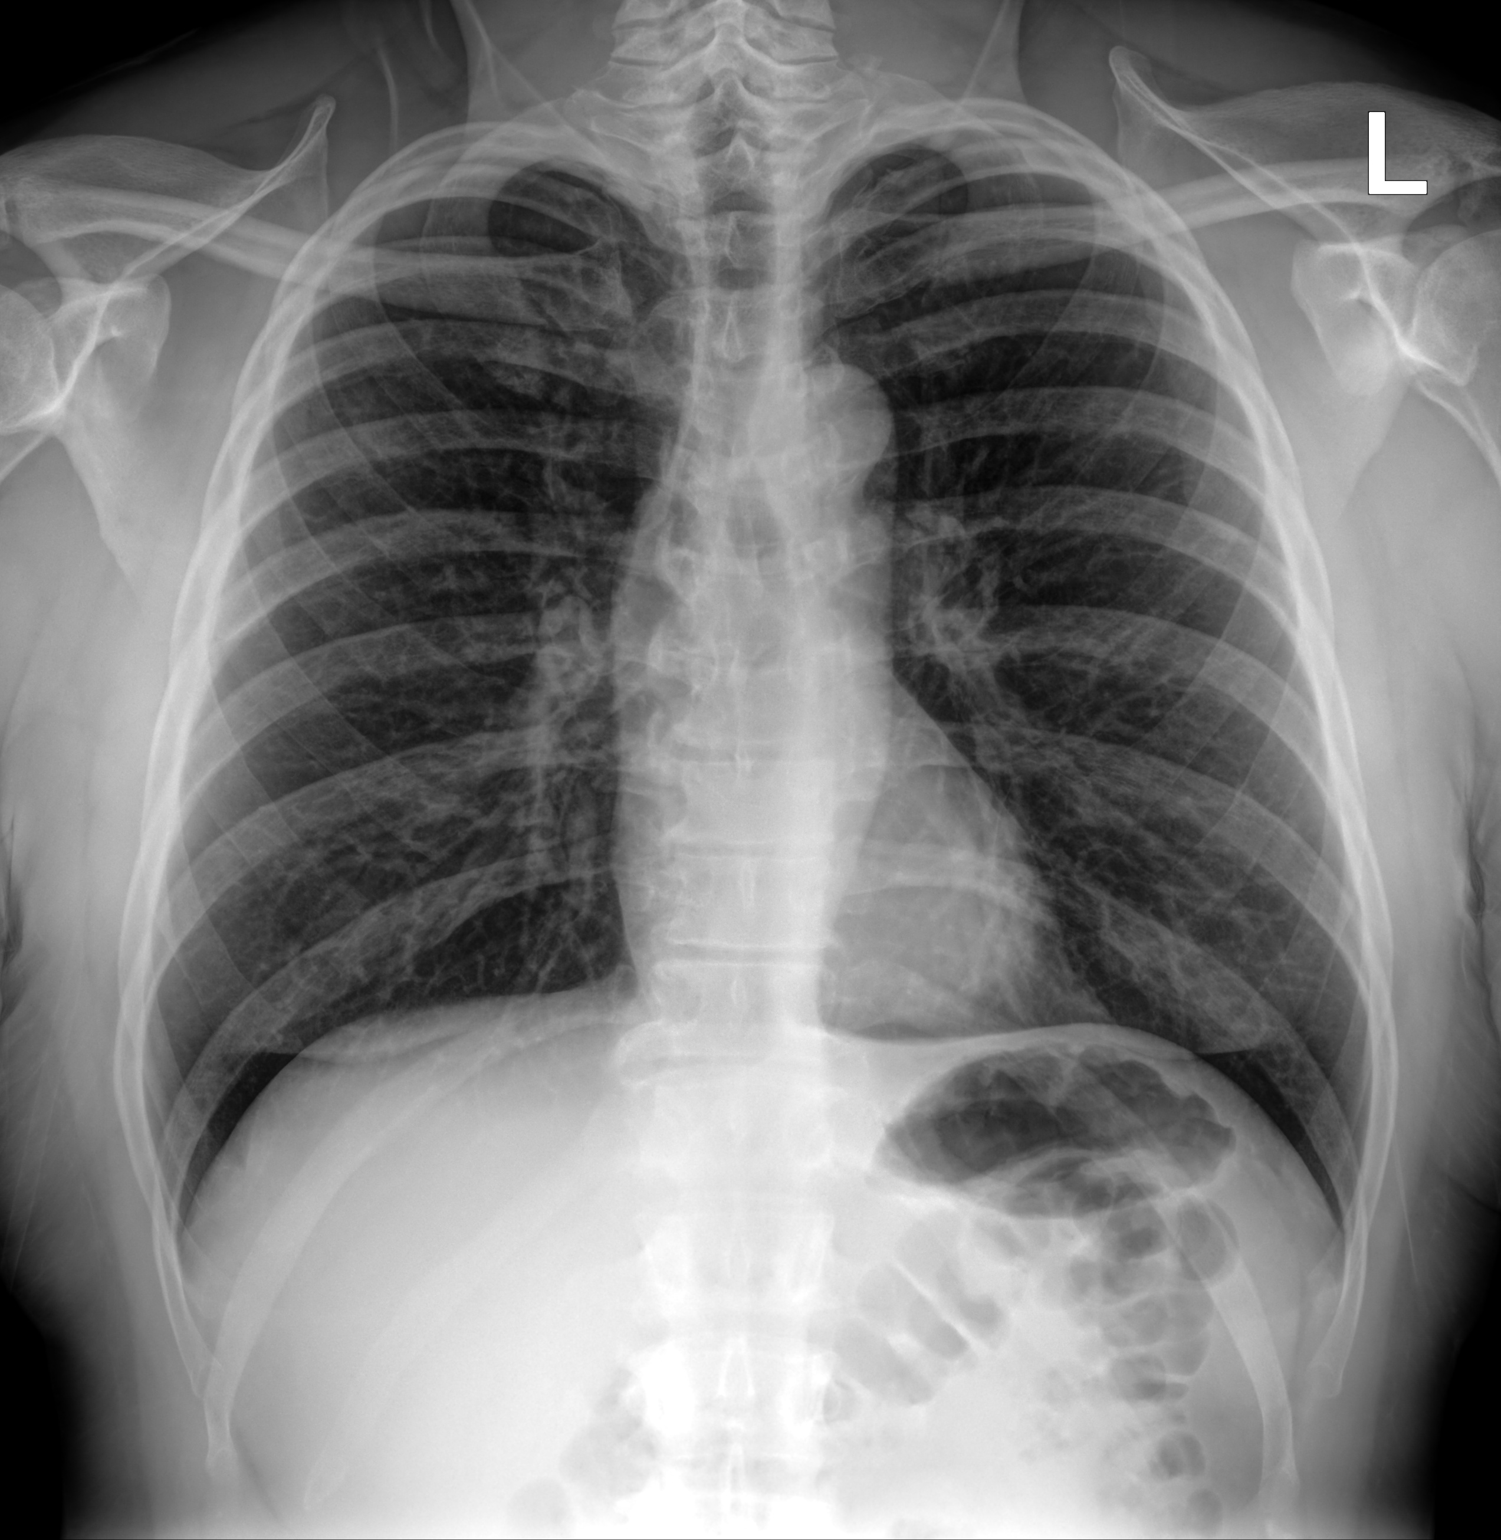

[chest lat]
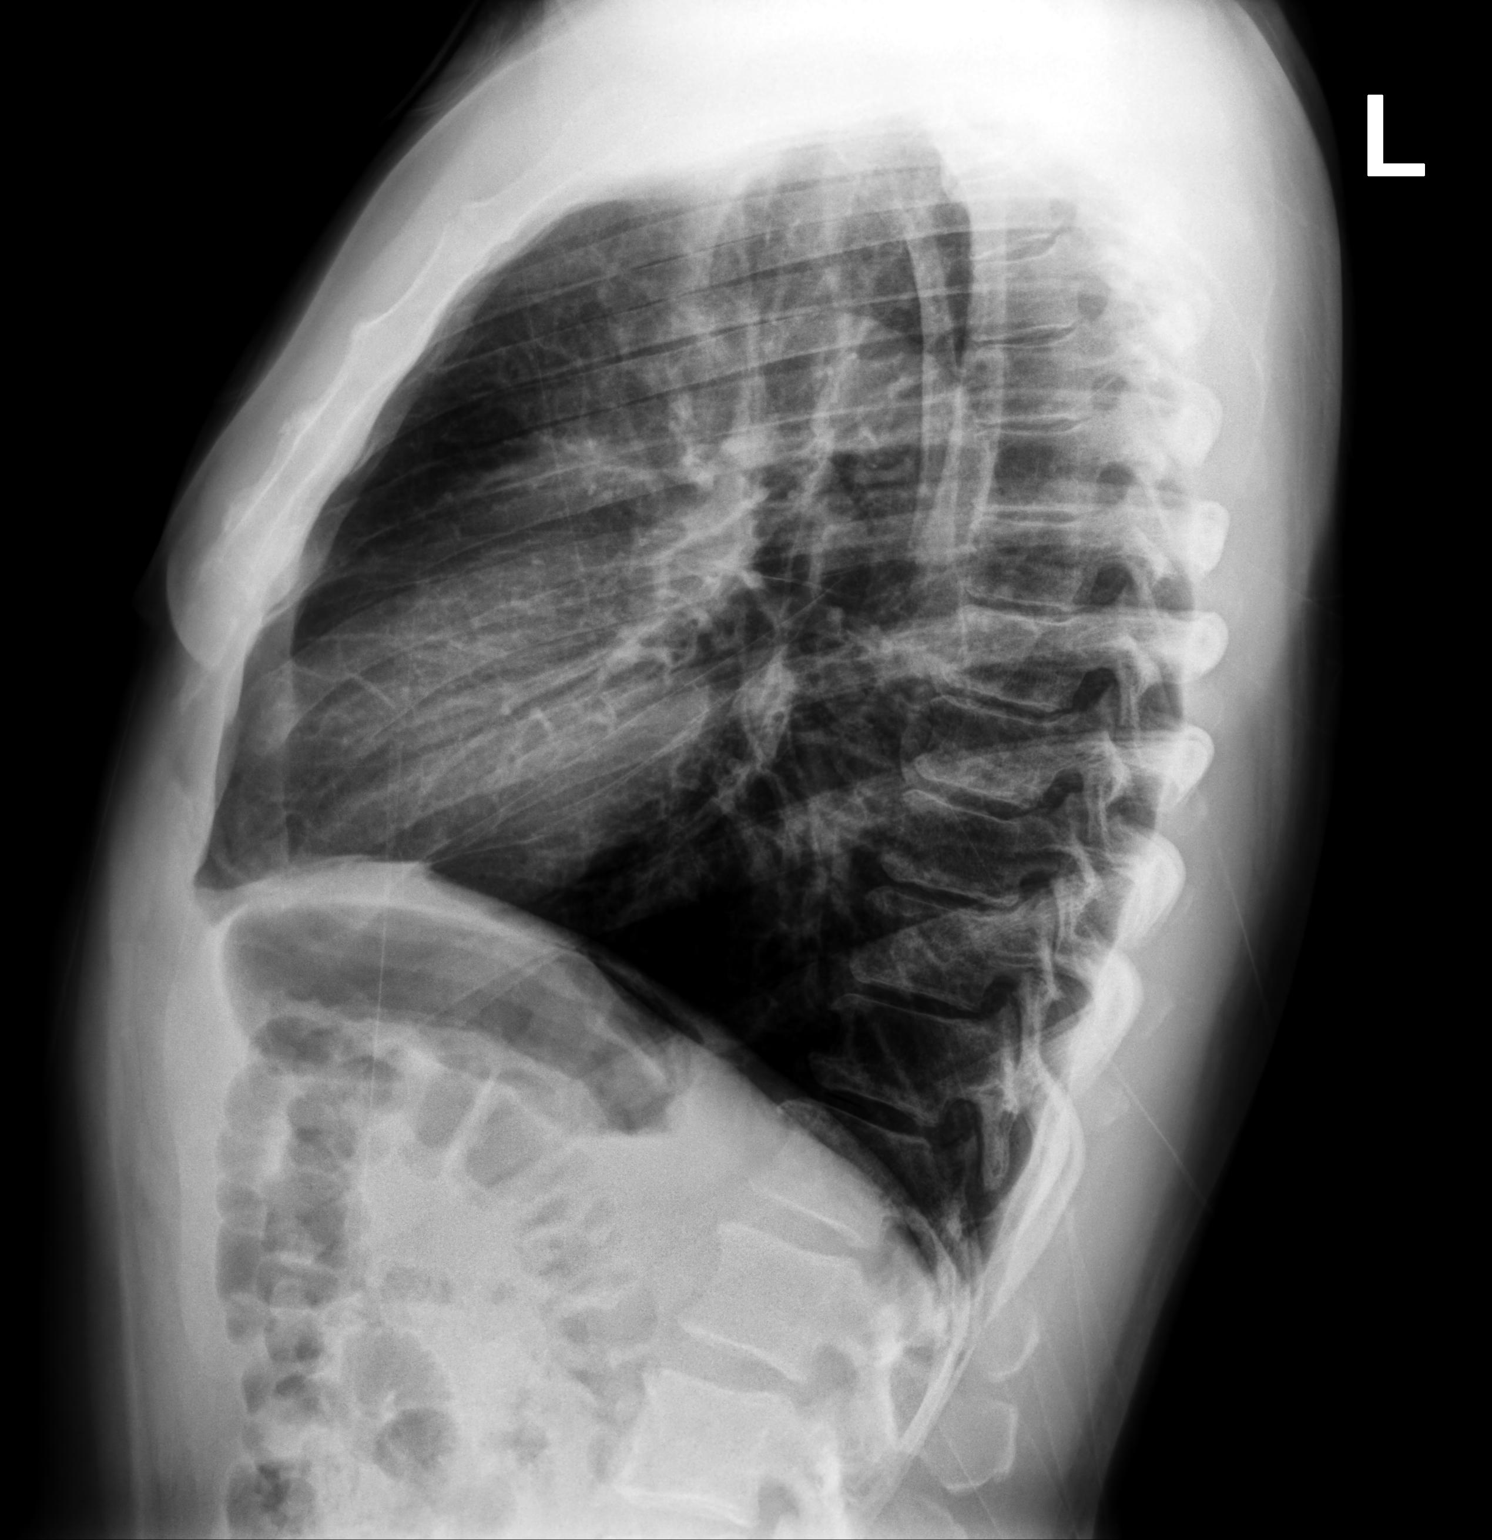

[2 of 2 positions shown; findings below may reference images not displayed]

FINDINGS: The cardiomediastinal contours are normal. Central bronchial
thickening. Pulmonary vasculature is normal. No consolidation,
pleural effusion, or pneumothorax. No acute osseous abnormalities
are seen.
IMPRESSION: Central bronchial thickening suggesting bronchitis, smoking, or
asthma.

## 2023-07-17 ENCOUNTER — Encounter (HOSPITAL_COMMUNITY): Payer: Self-pay

## 2023-07-17 ENCOUNTER — Emergency Department (HOSPITAL_COMMUNITY): Payer: Self-pay

## 2023-07-17 ENCOUNTER — Other Ambulatory Visit: Payer: Self-pay

## 2023-07-17 ENCOUNTER — Inpatient Hospital Stay (HOSPITAL_COMMUNITY): Payer: Self-pay

## 2023-07-17 ENCOUNTER — Inpatient Hospital Stay (HOSPITAL_COMMUNITY)
Admission: EM | Admit: 2023-07-17 | Discharge: 2023-07-21 | DRG: 516 | Disposition: A | Discharge status: Home / Self Care | Payer: Self-pay | Attending: General Surgery | Admitting: General Surgery

## 2023-07-17 DIAGNOSIS — I10 Essential (primary) hypertension: Secondary | ICD-10-CM | POA: Diagnosis present

## 2023-07-17 DIAGNOSIS — S2232XA Fracture of one rib, left side, initial encounter for closed fracture: Secondary | ICD-10-CM | POA: Diagnosis present

## 2023-07-17 DIAGNOSIS — F17290 Nicotine dependence, other tobacco product, uncomplicated: Secondary | ICD-10-CM | POA: Diagnosis present

## 2023-07-17 DIAGNOSIS — E559 Vitamin D deficiency, unspecified: Secondary | ICD-10-CM | POA: Diagnosis present

## 2023-07-17 DIAGNOSIS — M25551 Pain in right hip: Secondary | ICD-10-CM | POA: Diagnosis present

## 2023-07-17 DIAGNOSIS — S73014A Posterior dislocation of right hip, initial encounter: Secondary | ICD-10-CM | POA: Diagnosis present

## 2023-07-17 DIAGNOSIS — S32421A Displaced fracture of posterior wall of right acetabulum, initial encounter for closed fracture: Principal | ICD-10-CM

## 2023-07-17 DIAGNOSIS — R310 Gross hematuria: Secondary | ICD-10-CM | POA: Diagnosis present

## 2023-07-17 DIAGNOSIS — S72001A Fracture of unspecified part of neck of right femur, initial encounter for closed fracture: Secondary | ICD-10-CM | POA: Diagnosis present

## 2023-07-17 DIAGNOSIS — S32461A Displaced associated transverse-posterior fracture of right acetabulum, initial encounter for closed fracture: Secondary | ICD-10-CM | POA: Diagnosis present

## 2023-07-17 DIAGNOSIS — Y92488 Other paved roadways as the place of occurrence of the external cause: Secondary | ICD-10-CM | POA: Diagnosis not present

## 2023-07-17 DIAGNOSIS — N179 Acute kidney failure, unspecified: Secondary | ICD-10-CM | POA: Diagnosis present

## 2023-07-17 DIAGNOSIS — Z79899 Other long term (current) drug therapy: Secondary | ICD-10-CM | POA: Diagnosis not present

## 2023-07-17 DIAGNOSIS — F17200 Nicotine dependence, unspecified, uncomplicated: Secondary | ICD-10-CM | POA: Diagnosis present

## 2023-07-17 LAB — I-STAT CHEM 8, ED
BUN: 20 mg/dL (ref 6–20)
Calcium, Ion: 1.01 mmol/L — ABNORMAL LOW (ref 1.15–1.40)
Chloride: 105 mmol/L (ref 98–111)
Creatinine, Ser: 2.1 mg/dL — ABNORMAL HIGH (ref 0.61–1.24)
Glucose, Bld: 109 mg/dL — ABNORMAL HIGH (ref 70–99)
HCT: 46 % (ref 39.0–52.0)
Hemoglobin: 15.6 g/dL (ref 13.0–17.0)
Potassium: 3.7 mmol/L (ref 3.5–5.1)
Sodium: 142 mmol/L (ref 135–145)
TCO2: 21 mmol/L — ABNORMAL LOW (ref 22–32)

## 2023-07-17 LAB — PROTIME-INR
INR: 1 (ref 0.8–1.2)
Prothrombin Time: 13.4 s (ref 11.4–15.2)

## 2023-07-17 LAB — CBC
HCT: 43.1 % (ref 39.0–52.0)
HCT: 37.7 % — ABNORMAL LOW (ref 39.0–52.0)
Hemoglobin: 15.3 g/dL (ref 13.0–17.0)
Hemoglobin: 13.3 g/dL (ref 13.0–17.0)
MCH: 32.3 pg (ref 26.0–34.0)
MCH: 32.1 pg (ref 26.0–34.0)
MCHC: 35.5 g/dL (ref 30.0–36.0)
MCHC: 35.3 g/dL (ref 30.0–36.0)
MCV: 91.1 fL (ref 80.0–100.0)
MCV: 91.1 fL (ref 80.0–100.0)
Platelets: 356 10*3/uL (ref 150–400)
Platelets: 298 10*3/uL (ref 150–400)
RBC: 4.73 MIL/uL (ref 4.22–5.81)
RBC: 4.14 MIL/uL — ABNORMAL LOW (ref 4.22–5.81)
RDW: 11.8 % (ref 11.5–15.5)
RDW: 11.9 % (ref 11.5–15.5)
WBC: 15.1 10*3/uL — ABNORMAL HIGH (ref 4.0–10.5)
WBC: 12.3 10*3/uL — ABNORMAL HIGH (ref 4.0–10.5)
nRBC: 0 % (ref 0.0–0.2)
nRBC: 0 % (ref 0.0–0.2)

## 2023-07-17 LAB — COMPREHENSIVE METABOLIC PANEL
ALT: 38 U/L (ref 0–44)
AST: 56 U/L — ABNORMAL HIGH (ref 15–41)
Albumin: 3.9 g/dL (ref 3.5–5.0)
Alkaline Phosphatase: 54 U/L (ref 38–126)
Anion gap: 14 (ref 5–15)
BUN: 15 mg/dL (ref 6–20)
CO2: 22 mmol/L (ref 22–32)
Calcium: 9.1 mg/dL (ref 8.9–10.3)
Chloride: 105 mmol/L (ref 98–111)
Creatinine, Ser: 1.8 mg/dL — ABNORMAL HIGH (ref 0.61–1.24)
GFR, Estimated: 45 mL/min — ABNORMAL LOW (ref >60)
Glucose, Bld: 111 mg/dL — ABNORMAL HIGH (ref 70–99)
Potassium: 3.7 mmol/L (ref 3.5–5.1)
Sodium: 141 mmol/L (ref 135–145)
Total Bilirubin: 0.8 mg/dL (ref <1.2)
Total Protein: 7.4 g/dL (ref 6.5–8.1)

## 2023-07-17 LAB — SAMPLE TO BLOOD BANK

## 2023-07-17 LAB — I-STAT CG4 LACTIC ACID, ED: Lactic Acid, Venous: 4.3 mmol/L (ref 0.5–1.9)

## 2023-07-17 LAB — ETHANOL: Alcohol, Ethyl (B): 202 mg/dL — ABNORMAL HIGH (ref <10)

## 2023-07-17 LAB — CREATININE, SERUM
Creatinine, Ser: 1.54 mg/dL — ABNORMAL HIGH (ref 0.61–1.24)
GFR, Estimated: 54 mL/min — ABNORMAL LOW (ref >60)

## 2023-07-17 MED ORDER — METHOCARBAMOL 1000 MG/10ML IJ SOLN
500.0000 mg | Freq: Three times a day (TID) | INTRAMUSCULAR | Status: DC
  Administered 2023-07-17: 500 mg via INTRAVENOUS
  Filled 2023-07-17 (×2): qty 10

## 2023-07-17 MED ORDER — ENOXAPARIN SODIUM 30 MG/0.3ML IJ SOSY
30.0000 mg | PREFILLED_SYRINGE | Freq: Two times a day (BID) | INTRAMUSCULAR | Status: DC
  Administered 2023-07-18 – 2023-07-21 (×6): 30 mg via SUBCUTANEOUS
  Filled 2023-07-17 (×6): qty 0.3

## 2023-07-17 MED ORDER — SODIUM CHLORIDE 0.9 % IV SOLN
INTRAVENOUS | Status: AC | PRN
  Administered 2023-07-17: 1000 mL via INTRAVENOUS

## 2023-07-17 MED ORDER — METHOCARBAMOL 500 MG PO TABS
500.0000 mg | ORAL_TABLET | Freq: Three times a day (TID) | ORAL | Status: AC
  Administered 2023-07-17 – 2023-07-19 (×8): 500 mg via ORAL
  Filled 2023-07-17 (×10): qty 1

## 2023-07-17 MED ORDER — OXYCODONE HCL 5 MG PO TABS
5.0000 mg | ORAL_TABLET | ORAL | Status: DC | PRN
  Administered 2023-07-18 – 2023-07-19 (×4): 10 mg via ORAL
  Filled 2023-07-17 (×4): qty 2

## 2023-07-17 MED ORDER — DOCUSATE SODIUM 100 MG PO CAPS: 100.0000 mg | ORAL_CAPSULE | Freq: Two times a day (BID) | ORAL | Status: DC

## 2023-07-17 MED ORDER — PROPOFOL 10 MG/ML IV BOLUS
100.0000 mg | Freq: Once | INTRAVENOUS | Status: AC
  Administered 2023-07-17: 40 mg via INTRAVENOUS
  Filled 2023-07-17: qty 20

## 2023-07-17 MED ORDER — KETOROLAC TROMETHAMINE 15 MG/ML IJ SOLN
15.0000 mg | Freq: Three times a day (TID) | INTRAMUSCULAR | Status: DC
  Administered 2023-07-17 – 2023-07-19 (×7): 15 mg via INTRAVENOUS
  Filled 2023-07-17 (×7): qty 1

## 2023-07-17 MED ORDER — PROCHLORPERAZINE EDISYLATE 10 MG/2ML IJ SOLN: 10.0000 mg | INTRAMUSCULAR | Status: DC | PRN

## 2023-07-17 MED ORDER — METOPROLOL TARTRATE 5 MG/5ML IV SOLN: 5.0000 mg | Freq: Four times a day (QID) | INTRAVENOUS | Status: DC | PRN

## 2023-07-17 MED ORDER — HYDROMORPHONE HCL 1 MG/ML IJ SOLN
0.5000 mg | INTRAMUSCULAR | Status: DC | PRN
  Administered 2023-07-17: 0.5 mg via INTRAVENOUS
  Filled 2023-07-17: qty 1

## 2023-07-17 MED ORDER — METHOCARBAMOL 1000 MG/10ML IJ SOLN: 500.0000 mg | Freq: Four times a day (QID) | INTRAMUSCULAR | Status: DC | PRN

## 2023-07-17 MED ORDER — IOHEXOL 350 MG/ML SOLN
75.0000 mL | Freq: Once | INTRAVENOUS | Status: AC | PRN
Start: 2023-07-17 — End: 2023-07-17
  Administered 2023-07-17: 75 mL via INTRAVENOUS

## 2023-07-17 MED ORDER — DOCUSATE SODIUM 100 MG PO CAPS
100.0000 mg | ORAL_CAPSULE | Freq: Two times a day (BID) | ORAL | Status: DC
  Administered 2023-07-17 – 2023-07-18 (×4): 100 mg via ORAL
  Filled 2023-07-17 (×4): qty 1

## 2023-07-17 MED ORDER — SIMETHICONE 80 MG PO CHEW: 80.0000 mg | CHEWABLE_TABLET | Freq: Four times a day (QID) | ORAL | Status: DC | PRN

## 2023-07-17 MED ORDER — SODIUM CHLORIDE 0.9 % IV BOLUS
1000.0000 mL | Freq: Once | INTRAVENOUS | Status: AC
  Administered 2023-07-17: 1000 mL via INTRAVENOUS

## 2023-07-17 MED ORDER — GABAPENTIN 300 MG PO CAPS
300.0000 mg | ORAL_CAPSULE | Freq: Three times a day (TID) | ORAL | Status: DC
  Administered 2023-07-17 – 2023-07-21 (×12): 300 mg via ORAL
  Filled 2023-07-17 (×12): qty 1

## 2023-07-17 MED ORDER — ACETAMINOPHEN 500 MG PO TABS
1000.0000 mg | ORAL_TABLET | Freq: Four times a day (QID) | ORAL | Status: DC
  Administered 2023-07-17 – 2023-07-21 (×16): 1000 mg via ORAL
  Filled 2023-07-17 (×16): qty 2

## 2023-07-17 MED ORDER — ACETAMINOPHEN 325 MG PO TABS: 650.0000 mg | ORAL_TABLET | Freq: Four times a day (QID) | ORAL | Status: DC

## 2023-07-17 MED ORDER — HYDROMORPHONE HCL 1 MG/ML IJ SOLN: 1.0000 mg | INTRAMUSCULAR | Status: DC | PRN

## 2023-07-17 MED ORDER — ONDANSETRON HCL 4 MG/2ML IJ SOLN
4.0000 mg | Freq: Four times a day (QID) | INTRAMUSCULAR | Status: DC | PRN
  Administered 2023-07-19: 4 mg via INTRAVENOUS

## 2023-07-17 MED ORDER — POLYETHYLENE GLYCOL 3350 17 G PO PACK: 17.0000 g | PACK | Freq: Every day | ORAL | Status: DC | PRN

## 2023-07-17 MED ORDER — KETAMINE HCL 50 MG/5ML IJ SOSY
50.0000 mg | PREFILLED_SYRINGE | Freq: Once | INTRAMUSCULAR | Status: AC
  Administered 2023-07-17: 30 mg via INTRAVENOUS
  Filled 2023-07-17: qty 5

## 2023-07-17 MED ORDER — LORAZEPAM 2 MG/ML IJ SOLN
INTRAMUSCULAR | Status: AC
  Filled 2023-07-17: qty 1

## 2023-07-17 MED ORDER — HYDRALAZINE HCL 20 MG/ML IJ SOLN: 10.0000 mg | INTRAMUSCULAR | Status: DC | PRN

## 2023-07-17 MED ORDER — LACTATED RINGERS IV SOLN: INTRAVENOUS | Status: AC

## 2023-07-17 MED ORDER — ONDANSETRON 4 MG PO TBDP: 4.0000 mg | ORAL_TABLET | Freq: Four times a day (QID) | ORAL | Status: DC | PRN

## 2023-07-17 MED ORDER — HYDROMORPHONE HCL 1 MG/ML IJ SOLN
1.0000 mg | INTRAMUSCULAR | Status: DC | PRN
  Administered 2023-07-17: 1 mg via INTRAVENOUS
  Filled 2023-07-17: qty 1

## 2023-07-17 MED ORDER — LORAZEPAM 2 MG/ML IJ SOLN
1.0000 mg | Freq: Once | INTRAMUSCULAR | Status: AC
  Administered 2023-07-17: 1 mg via INTRAVENOUS

## 2023-07-17 NOTE — ED Notes (Signed)
[place] j removed by Dr. Eudelia Bunch.

## 2023-07-17 NOTE — ED Notes (Signed)
Patient to CT with RN and Ortho tech. Ortho tech Lauren removed traction for CT and reapplied when patient returned to room. Patient resting comfortably in bed at this time.
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
## 2023-07-17 NOTE — Progress Notes (Signed)
Orthopedic Tech Progress Note Patient Details:  Jordan Ewing 1971-03-20 [id number]  Ortho Devices Type of Ortho Device: Knee Immobilizer Ortho Device/Splint Location: RLE Ortho Device/Splint Interventions: Ordered, Application, Adjustment   Post Interventions Patient Tolerated: Well Instructions Provided: Adjustment of device, Care of device  Musculoskeletal Traction Type of Traction: Bucks Skin Traction Traction Location: RLE Traction Weight: 10 lbs   Post Interventions Patient Tolerated: Well Instructions Provided: Adjustment of device, Care of device  [name] 07/17/2023, 6:00 AM

## 2023-07-17 NOTE — Sedation Documentation (Signed)
Portable x-ray at bedside for post reduction films.

## 2023-07-17 NOTE — ED Notes (Signed)
Pt refuses to stay still. Staff instructed pt to stay still multiple times.

## 2023-07-17 NOTE — ED Notes (Signed)
CCMD called for central monitoring.

## 2023-07-17 NOTE — ED Notes (Signed)
Pt returned to room from CT

## 2023-07-17 NOTE — ED Notes (Signed)
[place] J applied, spinal precautions maintained.

## 2023-07-17 NOTE — ED Notes (Addendum)
Pt refusing pain medication. Pt yelling at staff. Pt's family member at bedside. Fiancee given pt's belongings.
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
## 2023-07-17 NOTE — Progress Notes (Signed)
Orthopedic Tech Progress Note Patient Details:  Jordan Ewing [birth year] [id number]  [name], TRN reached out so I could help [name], RN transport this pt to CT d/t the buck's traction in place. Traction was removed and promptly reapplied after the CT was complete, his knee immobilizer remained in place.  Patient ID: Jordan Ewing, male   DOB: February 18, 1971, 52 y.o.   MRN: [id number]  [name] 07/17/2023, 12:06 PM
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
## 2023-07-17 NOTE — ED Notes (Signed)
Patient transported to CT 
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
## 2023-07-17 NOTE — ED Notes (Signed)
Portable xray at bedside.
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
## 2023-07-17 NOTE — ED Notes (Signed)
Lauren, ortho tech to assist traction removal. Patient to CT 4 after removal of traction. TRN [name] aware.

## 2023-07-18 LAB — BASIC METABOLIC PANEL
Anion gap: 7 (ref 5–15)
BUN: 22 mg/dL — ABNORMAL HIGH (ref 6–20)
CO2: 23 mmol/L (ref 22–32)
Calcium: 8.3 mg/dL — ABNORMAL LOW (ref 8.9–10.3)
Chloride: 109 mmol/L (ref 98–111)
Creatinine, Ser: 1.44 mg/dL — ABNORMAL HIGH (ref 0.61–1.24)
GFR, Estimated: 59 mL/min — ABNORMAL LOW (ref >60)
Glucose, Bld: 102 mg/dL — ABNORMAL HIGH (ref 70–99)
Potassium: 4 mmol/L (ref 3.5–5.1)
Sodium: 139 mmol/L (ref 135–145)

## 2023-07-18 LAB — CBC
HCT: 38.7 % — ABNORMAL LOW (ref 39.0–52.0)
Hemoglobin: 13.5 g/dL (ref 13.0–17.0)
MCH: 31.8 pg (ref 26.0–34.0)
MCHC: 34.9 g/dL (ref 30.0–36.0)
MCV: 91.3 fL (ref 80.0–100.0)
Platelets: 305 10*3/uL (ref 150–400)
RBC: 4.24 MIL/uL (ref 4.22–5.81)
RDW: 12.1 % (ref 11.5–15.5)
WBC: 12 10*3/uL — ABNORMAL HIGH (ref 4.0–10.5)
nRBC: 0 % (ref 0.0–0.2)

## 2023-07-18 LAB — SURGICAL PCR SCREEN
MRSA, PCR: NEGATIVE
Staphylococcus aureus: NEGATIVE
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
## 2023-07-19 ENCOUNTER — Inpatient Hospital Stay (HOSPITAL_COMMUNITY): Payer: Self-pay

## 2023-07-19 ENCOUNTER — Encounter (HOSPITAL_COMMUNITY): Payer: Self-pay

## 2023-07-19 ENCOUNTER — Other Ambulatory Visit: Payer: Self-pay

## 2023-07-19 ENCOUNTER — Inpatient Hospital Stay (HOSPITAL_COMMUNITY): Payer: Self-pay | Admitting: Anesthesiology

## 2023-07-19 ENCOUNTER — Encounter (HOSPITAL_COMMUNITY): Admission: EM | Disposition: A | Discharge status: Home / Self Care | Payer: Self-pay | Source: Home / Self Care

## 2023-07-19 DIAGNOSIS — S32401A Unspecified fracture of right acetabulum, initial encounter for closed fracture: Secondary | ICD-10-CM

## 2023-07-19 HISTORY — PX: OPEN REDUCTION INTERNAL FIXATION ACETABULUM FRACTURE POSTERIOR: SHX6833

## 2023-07-19 LAB — CBC
HCT: 38.2 % — ABNORMAL LOW (ref 39.0–52.0)
Hemoglobin: 13.5 g/dL (ref 13.0–17.0)
MCH: 32.5 pg (ref 26.0–34.0)
MCHC: 35.3 g/dL (ref 30.0–36.0)
MCV: 91.8 fL (ref 80.0–100.0)
Platelets: 281 10*3/uL (ref 150–400)
RBC: 4.16 MIL/uL — ABNORMAL LOW (ref 4.22–5.81)
RDW: 11.9 % (ref 11.5–15.5)
WBC: 10.2 10*3/uL (ref 4.0–10.5)
nRBC: 0 % (ref 0.0–0.2)

## 2023-07-19 LAB — BASIC METABOLIC PANEL
Anion gap: 9 (ref 5–15)
BUN: 17 mg/dL (ref 6–20)
CO2: 22 mmol/L (ref 22–32)
Calcium: 8.3 mg/dL — ABNORMAL LOW (ref 8.9–10.3)
Chloride: 106 mmol/L (ref 98–111)
Creatinine, Ser: 1.33 mg/dL — ABNORMAL HIGH (ref 0.61–1.24)
GFR, Estimated: >60 mL/min (ref >60)
Glucose, Bld: 109 mg/dL — ABNORMAL HIGH (ref 70–99)
Potassium: 4 mmol/L (ref 3.5–5.1)
Sodium: 137 mmol/L (ref 135–145)

## 2023-07-19 SURGERY — OPEN REDUCTION INTERNAL FIXATION ACETABULUM FRACTURE POSTERIOR
Anesthesia: General | Site: Hip | Laterality: Right

## 2023-07-19 MED ORDER — FENTANYL CITRATE (PF) 100 MCG/2ML IJ SOLN
25.0000 ug | INTRAMUSCULAR | Status: DC | PRN
  Administered 2023-07-19: 50 ug via INTRAVENOUS

## 2023-07-19 MED ORDER — THIAMINE MONONITRATE 100 MG PO TABS
100.0000 mg | ORAL_TABLET | Freq: Every day | ORAL | Status: DC
  Administered 2023-07-20 – 2023-07-21 (×2): 100 mg via ORAL
  Filled 2023-07-19 (×2): qty 1

## 2023-07-19 MED ORDER — ONDANSETRON HCL 4 MG/2ML IJ SOLN: 4.0000 mg | Freq: Four times a day (QID) | INTRAMUSCULAR | Status: DC | PRN

## 2023-07-19 MED ORDER — ROCURONIUM BROMIDE 10 MG/ML (PF) SYRINGE
PREFILLED_SYRINGE | INTRAVENOUS | Status: DC | PRN
  Administered 2023-07-19 (×2): 50 mg via INTRAVENOUS

## 2023-07-19 MED ORDER — LIDOCAINE 2% (20 MG/ML) 5 ML SYRINGE
INTRAMUSCULAR | Status: AC
  Filled 2023-07-19: qty 5

## 2023-07-19 MED ORDER — 0.9 % SODIUM CHLORIDE (POUR BTL) OPTIME
TOPICAL | Status: DC | PRN
  Administered 2023-07-19: 1000 mL

## 2023-07-19 MED ORDER — DOCUSATE SODIUM 100 MG PO CAPS
100.0000 mg | ORAL_CAPSULE | Freq: Two times a day (BID) | ORAL | Status: DC
  Administered 2023-07-19 – 2023-07-21 (×4): 100 mg via ORAL
  Filled 2023-07-19 (×4): qty 1

## 2023-07-19 MED ORDER — ROCURONIUM BROMIDE 10 MG/ML (PF) SYRINGE
PREFILLED_SYRINGE | INTRAVENOUS | Status: AC
  Filled 2023-07-19: qty 10

## 2023-07-19 MED ORDER — ORAL CARE MOUTH RINSE
15.0000 mL | Freq: Once | OROMUCOSAL | Status: AC
Start: 2023-07-19 — End: 2023-07-19

## 2023-07-19 MED ORDER — TRANEXAMIC ACID-NACL 1000-0.7 MG/100ML-% IV SOLN
1000.0000 mg | Freq: Once | INTRAVENOUS | Status: AC
  Administered 2023-07-19: 1000 mg via INTRAVENOUS
  Filled 2023-07-19: qty 100

## 2023-07-19 MED ORDER — OXYCODONE HCL 5 MG PO TABS
5.0000 mg | ORAL_TABLET | ORAL | Status: DC | PRN
  Administered 2023-07-20 (×2): 10 mg via ORAL
  Filled 2023-07-19 (×2): qty 2

## 2023-07-19 MED ORDER — MIDAZOLAM HCL 2 MG/2ML IJ SOLN
INTRAMUSCULAR | Status: AC
  Filled 2023-07-19: qty 2

## 2023-07-19 MED ORDER — DEXTROSE-SODIUM CHLORIDE 5-0.45 % IV SOLN: INTRAVENOUS | Status: AC

## 2023-07-19 MED ORDER — SUGAMMADEX SODIUM 200 MG/2ML IV SOLN
INTRAVENOUS | Status: DC | PRN
  Administered 2023-07-19: 200 mg via INTRAVENOUS

## 2023-07-19 MED ORDER — MIDAZOLAM HCL 2 MG/2ML IJ SOLN
INTRAMUSCULAR | Status: DC | PRN
  Administered 2023-07-19: 2 mg via INTRAVENOUS

## 2023-07-19 MED ORDER — EPHEDRINE SULFATE-NACL 50-0.9 MG/10ML-% IV SOSY
PREFILLED_SYRINGE | INTRAVENOUS | Status: DC | PRN
  Administered 2023-07-19 (×3): 5 mg via INTRAVENOUS
  Administered 2023-07-19: 10 mg via INTRAVENOUS

## 2023-07-19 MED ORDER — DEXAMETHASONE SODIUM PHOSPHATE 10 MG/ML IJ SOLN
INTRAMUSCULAR | Status: DC | PRN
  Administered 2023-07-19: 8 mg via INTRAVENOUS

## 2023-07-19 MED ORDER — CHLORHEXIDINE GLUCONATE 0.12 % MT SOLN
OROMUCOSAL | Status: AC
  Administered 2023-07-19: 15 mL via OROMUCOSAL
  Filled 2023-07-19: qty 15

## 2023-07-19 MED ORDER — EPHEDRINE 5 MG/ML INJ
INTRAVENOUS | Status: AC
  Filled 2023-07-19: qty 5

## 2023-07-19 MED ORDER — FENTANYL CITRATE (PF) 250 MCG/5ML IJ SOLN
INTRAMUSCULAR | Status: AC
  Filled 2023-07-19: qty 5

## 2023-07-19 MED ORDER — ONDANSETRON HCL 4 MG/2ML IJ SOLN
INTRAMUSCULAR | Status: AC
  Filled 2023-07-19: qty 2

## 2023-07-19 MED ORDER — PHENYLEPHRINE HCL (PRESSORS) 10 MG/ML IV SOLN
INTRAVENOUS | Status: DC | PRN
  Administered 2023-07-19: 80 ug via INTRAVENOUS

## 2023-07-19 MED ORDER — KETOROLAC TROMETHAMINE 15 MG/ML IJ SOLN
15.0000 mg | Freq: Four times a day (QID) | INTRAMUSCULAR | Status: AC
  Administered 2023-07-19 – 2023-07-20 (×3): 15 mg via INTRAVENOUS
  Filled 2023-07-19 (×4): qty 1

## 2023-07-19 MED ORDER — SODIUM CHLORIDE 0.9 % IV SOLN
INTRAVENOUS | Status: DC | PRN
  Administered 2023-07-19: 2 g via INTRAVENOUS

## 2023-07-19 MED ORDER — LORAZEPAM 2 MG/ML IJ SOLN: 1.0000 mg | INTRAMUSCULAR | Status: DC | PRN

## 2023-07-19 MED ORDER — LACTATED RINGERS IV SOLN: INTRAVENOUS | Status: DC

## 2023-07-19 MED ORDER — FENTANYL CITRATE (PF) 100 MCG/2ML IJ SOLN
INTRAMUSCULAR | Status: AC
  Administered 2023-07-19: 50 ug via INTRAVENOUS
  Filled 2023-07-19: qty 2

## 2023-07-19 MED ORDER — HYDROMORPHONE HCL 1 MG/ML IJ SOLN: 0.5000 mg | INTRAMUSCULAR | Status: DC | PRN

## 2023-07-19 MED ORDER — CEFAZOLIN SODIUM-DEXTROSE 2-4 GM/100ML-% IV SOLN: 2.0000 g | Freq: Once | INTRAVENOUS | Status: DC

## 2023-07-19 MED ORDER — ADULT MULTIVITAMIN W/MINERALS CH
1.0000 | ORAL_TABLET | Freq: Every day | ORAL | Status: DC
  Administered 2023-07-20 – 2023-07-21 (×2): 1 via ORAL
  Filled 2023-07-19 (×2): qty 1

## 2023-07-19 MED ORDER — OXYCODONE HCL 5 MG/5ML PO SOLN: 5.0000 mg | Freq: Once | ORAL | Status: DC | PRN

## 2023-07-19 MED ORDER — PROPOFOL 10 MG/ML IV BOLUS
INTRAVENOUS | Status: DC | PRN
  Administered 2023-07-19: 200 mg via INTRAVENOUS

## 2023-07-19 MED ORDER — LIDOCAINE 2% (20 MG/ML) 5 ML SYRINGE
INTRAMUSCULAR | Status: DC | PRN
  Administered 2023-07-19: 60 mg via INTRAVENOUS

## 2023-07-19 MED ORDER — CEFAZOLIN SODIUM-DEXTROSE 2-4 GM/100ML-% IV SOLN
2.0000 g | Freq: Three times a day (TID) | INTRAVENOUS | Status: AC
  Administered 2023-07-19 – 2023-07-20 (×3): 2 g via INTRAVENOUS
  Filled 2023-07-19 (×3): qty 100

## 2023-07-19 MED ORDER — VANCOMYCIN HCL 1000 MG IV SOLR
INTRAVENOUS | Status: AC
  Filled 2023-07-19: qty 20

## 2023-07-19 MED ORDER — DEXAMETHASONE SODIUM PHOSPHATE 10 MG/ML IJ SOLN
INTRAMUSCULAR | Status: AC
  Filled 2023-07-19: qty 1

## 2023-07-19 MED ORDER — CHLORHEXIDINE GLUCONATE 0.12 % MT SOLN: 15.0000 mL | Freq: Once | OROMUCOSAL | Status: AC

## 2023-07-19 MED ORDER — PROPOFOL 10 MG/ML IV BOLUS
INTRAVENOUS | Status: AC
  Filled 2023-07-19: qty 20

## 2023-07-19 MED ORDER — FOLIC ACID 1 MG PO TABS
1.0000 mg | ORAL_TABLET | Freq: Every day | ORAL | Status: DC
  Administered 2023-07-20 – 2023-07-21 (×2): 1 mg via ORAL
  Filled 2023-07-19 (×2): qty 1

## 2023-07-19 MED ORDER — DIPHENHYDRAMINE HCL 12.5 MG/5ML PO ELIX: 12.5000 mg | ORAL_SOLUTION | ORAL | Status: DC | PRN

## 2023-07-19 MED ORDER — THIAMINE HCL 100 MG/ML IJ SOLN: 100.0000 mg | Freq: Every day | INTRAMUSCULAR | Status: DC

## 2023-07-19 MED ORDER — TRANEXAMIC ACID-NACL 1000-0.7 MG/100ML-% IV SOLN
1000.0000 mg | INTRAVENOUS | Status: AC
  Administered 2023-07-19: 1000 mg via INTRAVENOUS
  Filled 2023-07-19: qty 100

## 2023-07-19 MED ORDER — FENTANYL CITRATE (PF) 250 MCG/5ML IJ SOLN
INTRAMUSCULAR | Status: DC | PRN
  Administered 2023-07-19 (×5): 50 ug via INTRAVENOUS

## 2023-07-19 MED ORDER — OXYCODONE HCL 5 MG PO TABS: 5.0000 mg | ORAL_TABLET | Freq: Once | ORAL | Status: DC | PRN

## 2023-07-19 MED ORDER — OXYCODONE HCL 5 MG PO TABS
10.0000 mg | ORAL_TABLET | ORAL | Status: DC | PRN
  Administered 2023-07-19 – 2023-07-20 (×2): 15 mg via ORAL
  Administered 2023-07-20: 10 mg via ORAL
  Administered 2023-07-21 (×2): 15 mg via ORAL
  Filled 2023-07-19 (×4): qty 3
  Filled 2023-07-19: qty 2

## 2023-07-19 MED ORDER — VANCOMYCIN HCL 1 G IV SOLR
INTRAVENOUS | Status: DC | PRN
  Administered 2023-07-19: 1000 mg via TOPICAL

## 2023-07-19 MED ORDER — LORAZEPAM 1 MG PO TABS: 1.0000 mg | ORAL_TABLET | ORAL | Status: DC | PRN

## 2023-07-19 SURGICAL SUPPLY — 72 items
ANTIFOG SOL W/FOAM PAD STRL (MISCELLANEOUS) ×1 IMPLANT
BAG COUNTER SPONGE SURGICOUNT (BAG) IMPLANT
BIT DRILL CANN 4.5MM (BIT) IMPLANT
BIT DRILL FLUTED 2.5 (BIT) IMPLANT
BLADE SURG 11 STRL SS (BLADE) ×2 IMPLANT
BRUSH SCRUB EZ PLAIN DRY (MISCELLANEOUS) ×4 IMPLANT
CHLORAPREP W/TINT 26 (MISCELLANEOUS) ×4 IMPLANT
COVER SURGICAL LIGHT HANDLE (MISCELLANEOUS) ×2 IMPLANT
DERMABOND ADVANCED .7 DNX12 (GAUZE/BANDAGES/DRESSINGS) IMPLANT
DRAPE C-ARM 42X72 X-RAY (DRAPES) ×2 IMPLANT
DRAPE C-ARMOR (DRAPES) ×2 IMPLANT
DRAPE INCISE IOBAN 85X60 (DRAPES) ×2 IMPLANT
DRAPE SURG 17X23 STRL (DRAPES) ×12 IMPLANT
DRAPE SURG ORHT 6 SPLT 77X108 (DRAPES) ×4 IMPLANT
DRAPE U-SHAPE 47X51 STRL (DRAPES) ×2 IMPLANT
DRESSING MEPILEX FLEX 4X4 (GAUZE/BANDAGES/DRESSINGS) IMPLANT
DRILL BIT FLUTED 2.5 (BIT) ×1 IMPLANT
DRSG MEPILEX FLEX 4X4 (GAUZE/BANDAGES/DRESSINGS) ×1 IMPLANT
DRSG MEPILEX POST OP 4X12 (GAUZE/BANDAGES/DRESSINGS) IMPLANT
DRSG MEPILEX POST OP 4X8 (GAUZE/BANDAGES/DRESSINGS) IMPLANT
ELECT BLADE 4.0 EZ CLEAN MEGAD (MISCELLANEOUS) ×1 IMPLANT
ELECT BLADE 6.5 EXT (BLADE) IMPLANT
ELECT REM PT RETURN 9FT ADLT (ELECTROSURGICAL) ×1 IMPLANT
ELECTRODE BLDE 4.0 EZ CLN MEGD (MISCELLANEOUS) ×2 IMPLANT
ELECTRODE REM PT RTRN 9FT ADLT (ELECTROSURGICAL) ×2 IMPLANT
GLOVE BIO SURGEON STRL SZ 6.5 (GLOVE) ×6 IMPLANT
GLOVE BIO SURGEON STRL SZ7.5 (GLOVE) ×8 IMPLANT
GLOVE BIOGEL PI IND STRL 6.5 (GLOVE) ×2 IMPLANT
GLOVE BIOGEL PI IND STRL 7.5 (GLOVE) ×2 IMPLANT
GOWN STRL REUS W/ TWL LRG LVL3 (GOWN DISPOSABLE) ×4 IMPLANT
GUIDEWIRE 2.0MM (WIRE) IMPLANT
GUIDEWIRE 2.8 THREAD 450 L ST (WIRE) IMPLANT
K-WIRE 1.6X150 (WIRE) ×1 IMPLANT
KIT BASIN OR (CUSTOM PROCEDURE TRAY) ×2 IMPLANT
KIT TURNOVER KIT B (KITS) ×2 IMPLANT
KWIRE 1.6X150 (WIRE) IMPLANT
MANIFOLD NEPTUNE II (INSTRUMENTS) ×2 IMPLANT
NS IRRIG 1000ML POUR BTL (IV SOLUTION) ×2 IMPLANT
PACK TOTAL JOINT (CUSTOM PROCEDURE TRAY) ×2 IMPLANT
PACK UNIVERSAL I (CUSTOM PROCEDURE TRAY) ×2 IMPLANT
PAD ARMBOARD 7.5X6 YLW CONV (MISCELLANEOUS) ×4 IMPLANT
PLATE BONE 91MM 7HOLE PELVIC (Plate) IMPLANT
PLATE BONE LOCK 65MM 5 HOLE (Plate) IMPLANT
RETRIEVER SUT HEWSON (MISCELLANEOUS) IMPLANT
SCREW CANN 32 THRD/120 7.3 (Screw) IMPLANT
SCREW LOCK CORT ST 3.5X20 (Screw) IMPLANT
SCREW LOCK CORT ST 3.5X22 (Screw) IMPLANT
SCREW LOCK CORT ST 3.5X28 (Screw) IMPLANT
SCREW LOCK CORT ST 3.5X30 (Screw) IMPLANT
SCREW LOCK CORT ST 3.5X36 (Screw) IMPLANT
SCREW PELVIC CORT ST 3.5X50 (Screw) IMPLANT
SCREW PELVIC CORT ST 3.5X55 (Screw) IMPLANT
SET HNDPC FAN SPRY TIP SCT (DISPOSABLE) ×2 IMPLANT
SOLUTION ANTFG W/FOAM PAD STRL (MISCELLANEOUS) ×2 IMPLANT
SPONGE T-LAP 18X18 ~~LOC~~+RFID (SPONGE) IMPLANT
STAPLER VISISTAT 35W (STAPLE) IMPLANT
SUCTION TUBE FRAZIER 10FR DISP (SUCTIONS) ×2 IMPLANT
SUT ETHILON 2 0 PSLX (SUTURE) ×4 IMPLANT
SUT FIBERWIRE #2 38 T-5 BLUE (SUTURE) ×2 IMPLANT
SUT MNCRL AB 3-0 PS2 18 (SUTURE) ×2 IMPLANT
SUT MON AB 2-0 CT1 36 (SUTURE) ×2 IMPLANT
SUT VIC AB 0 CT1 27XBRD ANBCTR (SUTURE) ×2 IMPLANT
SUT VIC AB 1 CT1 18XCR BRD 8 (SUTURE) ×2 IMPLANT
SUT VIC AB 1 CT1 27XBRD ANBCTR (SUTURE) ×2 IMPLANT
SUT VIC AB 2-0 CT1 TAPERPNT 27 (SUTURE) ×2 IMPLANT
SUTURE FIBERWR #2 38 T-5 BLUE (SUTURE) IMPLANT
TOWEL GREEN STERILE (TOWEL DISPOSABLE) ×4 IMPLANT
TOWEL GREEN STERILE FF (TOWEL DISPOSABLE) ×4 IMPLANT
TRAY CATH INTERMITTENT SS 16FR (CATHETERS) IMPLANT
TRAY FOLEY MTR SLVR 16FR STAT (SET/KITS/TRAYS/PACK) IMPLANT
UNDERPAD 30X36 HEAVY ABSORB (UNDERPADS AND DIAPERS) ×2 IMPLANT
WATER STERILE IRR 1000ML POUR (IV SOLUTION) IMPLANT

## 2023-07-19 NOTE — Plan of Care (Signed)

## 2023-07-19 NOTE — Transfer of Care (Signed)
Immediate Anesthesia Transfer of Care Note  Patient: Jordan Ewing  Procedure(s) Performed: OPEN REDUCTION INTERNAL FIXATION ACETABULUM FRACTURE POSTERIOR (Right: Hip)  Patient Location: PACU  Anesthesia Type:General  Level of Consciousness: sedated, drowsy, and patient cooperative  Airway & Oxygen Therapy: Patient Spontanous Breathing and Patient connected to nasal cannula oxygen  Post-op Assessment: Report given to RN and Post -op Vital signs reviewed and stable  Post vital signs: Reviewed and stable  Last Vitals:  Vitals Value Taken Time  BP 147/87 07/19/23 1434  Temp    Pulse 101 07/19/23 1436  Resp 15 07/19/23 1436  SpO2 92 % 07/19/23 1436  Vitals shown include unfiled device data.  Last Pain:  Vitals:   07/19/23 0952  TempSrc:   PainSc: 0-No pain      Patients Stated Pain Goal: 0 (07/19/23 6213)  Complications: No notable events documented.

## 2023-07-19 NOTE — Progress Notes (Signed)
Transition of Care [facility]) - CAGE-AID Screening   Patient Details  Name: Jordan Ewing MRN: [id number] Date of Birth: 1971-07-24  Transition of Care [facility]) CM/SW Contact:    Katha Hamming, RN Phone Number: 07/19/2023, 10:26 PM   Clinical Narrative:  Reports marijuana use. No resources indicated.  CAGE-AID Screening:    Have You Ever Felt You Ought to Cut Down on Your Drinking or Drug Use?: No Have People Annoyed You By Critizing Your Drinking Or Drug Use?: No Have You Felt Bad Or Guilty About Your Drinking Or Drug Use?: No Have You Ever Had a Drink or Used Drugs First Thing In The Morning to Steady Your Nerves or to Get Rid of a Hangover?: No CAGE-AID Score: 0

## 2023-07-19 NOTE — Anesthesia Procedure Notes (Signed)
Procedure Name: Intubation Date/Time: 07/19/2023 11:24 AM  Performed by: Hilda Lias, CRNAPre-anesthesia Checklist: Patient identified, Emergency Drugs available, Suction available and Patient being monitored Patient Re-evaluated:Patient Re-evaluated prior to induction Oxygen Delivery Method: Circle System Utilized Preoxygenation: Pre-oxygenation with 100% oxygen Induction Type: IV induction Ventilation: Mask ventilation without difficulty Laryngoscope Size: Mac and 3 Grade View: Grade II Tube type: Oral Tube size: 7.5 mm Number of attempts: 1 Airway Equipment and Method: Stylet and Oral airway Placement Confirmation: ETT inserted through vocal cords under direct vision, positive ETCO2 and breath sounds checked- equal and bilateral Secured at: 23 cm Tube secured with: Tape Dental Injury: Teeth and Oropharynx as per pre-operative assessment  Comments: Loose tooth noted top back left. Still intact after intubation.

## 2023-07-19 NOTE — Interval H&P Note (Signed)
History and Physical Interval Note:  07/19/2023 10:21 AM  Jordan Ewing  has presented today for surgery, with the diagnosis of Right acetabular fracture.  The various methods of treatment have been discussed with the patient and family. After consideration of risks, benefits and other options for treatment, the patient has consented to  Procedure(s): OPEN REDUCTION INTERNAL FIXATION ACETABULUM FRACTURE POSTERIOR (Right) as a surgical intervention.  The patient's history has been reviewed, patient examined, no change in status, stable for surgery.  I have reviewed the patient's chart and labs.  Questions were answered to the patient's satisfaction.     Caryn Bee P Ashantia Amaral
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
## 2023-07-19 NOTE — Progress Notes (Signed)
CCC Pre-op Review  Pre-op checklist:   completed  NPO:  since 2200 12/22  Labs: 12/23 @0535   H/H  13.5 / 38.2  plts 281               Glu 109  k4,0  Cr / BUN  1.33 / 17  Consent:   informed  H&P:  needs interval note / update  Vitals:  @0753   98.6 - 73 - 16  121/96(106)  100% RA  O2 requirements:    RA  MAR/PTA review:   IV:    20g LUA  +  20g RH  Floor nurse name:   Sidonie Dickens RN  Additional info:     AKI  / hematuria yesterday / cleared     H/o etoh 9 on CIWA)  / uses marijuana

## 2023-07-19 NOTE — Anesthesia Preprocedure Evaluation (Signed)
Anesthesia Evaluation  Patient identified by MRN, date of birth, ID band Patient awake    Reviewed: Allergy & Precautions, H&P , NPO status , Patient's Chart, lab work & pertinent test results  Airway Mallampati: II   Neck ROM: full    Dental   Pulmonary Current Smoker   breath sounds clear to auscultation       Cardiovascular hypertension,  Rhythm:regular Rate:Normal     Neuro/Psych    GI/Hepatic   Endo/Other    Renal/GU Renal InsufficiencyRenal disease     Musculoskeletal   Abdominal   Peds  Hematology   Anesthesia Other Findings   Reproductive/Obstetrics                             Anesthesia Physical Anesthesia Plan  ASA: 2  Anesthesia Plan: General   Post-op Pain Management:    Induction: Intravenous  PONV Risk Score and Plan: 1 and Ondansetron, Dexamethasone, Midazolam and Treatment may vary due to age or medical condition  Airway Management Planned: Oral ETT  Additional Equipment:   Intra-op Plan:   Post-operative Plan: Extubation in OR  Informed Consent: I have reviewed the patients History and Physical, chart, labs and discussed the procedure including the risks, benefits and alternatives for the proposed anesthesia with the patient or authorized representative who has indicated his/her understanding and acceptance.     Dental advisory given  Plan Discussed with: CRNA, Anesthesiologist and Surgeon  Anesthesia Plan Comments:        Anesthesia Quick Evaluation

## 2023-07-19 NOTE — Discharge Instructions (Signed)
[facility] Discharge Instructions   General Discharge Instructions  WEIGHT BEARING STATUS:touchdown weightbearing right lower extremity  RANGE OF MOTION/ACTIVITY: ok for hip motion as tolerated  Wound Care: You may remove your surgical dressing on post op day 2, (07/21/23). Incisions can be left open to air if there is no drainage. Once the incision is completely dry and without drainage, it may be left open to air out.  Showering may begin post op day 3, (07/22/23).  Clean incision gently with soap and water.  DVT/PE prophylaxis:  Eliquis 2.5 mg twice daily x 30 days  Diet: as you were eating previously.  Can use over the counter stool softeners and bowel preparations, such as Miralax, to help with bowel movements.  Narcotics can be constipating.  Be sure to drink plenty of fluids  PAIN MEDICATION USE AND EXPECTATIONS  You have likely been given narcotic medications to help control your pain.  After a traumatic event that results in an fracture (broken bone) with or without surgery, it is ok to use narcotic pain medications to help control one's pain.  We understand that everyone responds to pain differently and each individual patient will be evaluated on a regular basis for the continued need for narcotic medications. Ideally, narcotic medication use should last no more than 6-8 weeks (coinciding with fracture healing).   As a patient it is your responsibility as well to monitor narcotic medication use and report the amount and frequency you use these medications when you come to your office visit.   We would also advise that if you are using narcotic medications, you should take a dose prior to therapy to maximize you participation.  IF YOU ARE ON NARCOTIC MEDICATIONS IT IS NOT PERMISSIBLE TO OPERATE A MOTOR VEHICLE (MOTORCYCLE/CAR/TRUCK/MOPED) OR HEAVY MACHINERY DO NOT MIX NARCOTICS WITH OTHER CNS (CENTRAL NERVOUS SYSTEM) DEPRESSANTS SUCH AS ALCOHOL   STOP SMOKING OR  USING NICOTINE PRODUCTS!!!!  As discussed nicotine severely impairs your body's ability to heal surgical and traumatic wounds but also impairs bone healing.  Wounds and bone heal by forming microscopic blood vessels (angiogenesis) and nicotine is a vasoconstrictor (essentially, shrinks blood vessels).  Therefore, if vasoconstriction occurs to these microscopic blood vessels they essentially disappear and are unable to deliver necessary nutrients to the healing tissue.  This is one modifiable factor that you can do to dramatically increase your chances of healing your injury.    (This means no smoking, no nicotine gum, patches, etc)  DO NOT USE NONSTEROIDAL ANTI-INFLAMMATORY DRUGS (NSAID'S)  Using products such as Advil (ibuprofen), Aleve (naproxen), Motrin (ibuprofen) for additional pain control during fracture healing can delay and/or prevent the healing response.  If you would like to take over the counter (OTC) medication, Tylenol (acetaminophen) is ok.  However, some narcotic medications that are given for pain control contain acetaminophen as well. Therefore, you should not exceed more than 4000 mg of tylenol in a day if you do not have liver disease.  Also note that there are may OTC medicines, such as cold medicines and allergy medicines that my contain tylenol as well.  If you have any questions about medications and/or interactions please ask your doctor/PA or your pharmacist.      ICE AND ELEVATE INJURED/OPERATIVE EXTREMITY  Using ice and elevating the injured extremity above your heart can help with swelling and pain control.  Icing in a pulsatile fashion, such as 20 minutes on and 20 minutes off, can be followed.  Do not place ice directly on skin. Make sure there is a barrier between to skin and the ice pack.    Using frozen items such as frozen peas works well as the conform nicely to the are that needs to be iced.  USE AN ACE WRAP OR TED HOSE FOR SWELLING CONTROL  In addition to icing  and elevation, Ace wraps or TED hose are used to help limit and resolve swelling.  It is recommended to use Ace wraps or TED hose until you are informed to stop.    When using Ace Wraps start the wrapping distally (farthest away from the body) and wrap proximally (closer to the body)   Example: If you had surgery on your leg or thing and you do not have a splint on, start the ace wrap at the toes and work your way up to the thigh        If you had surgery on your upper extremity and do not have a splint on, start the ace wrap at your fingers and work your way up to the upper arm   CALL THE OFFICE FOR MEDICATION OR WITH ANY QUESTIONS/CONCERNS: [phone]   VISIT OUR WEBSITE FOR ADDITIONAL INFORMATION: [email or web address]     Discharge Wound Care Instructions  Do NOT apply any ointments, solutions or lotions to pin sites or surgical wounds.  These prevent needed drainage and even though solutions like hydrogen peroxide kill bacteria, they also damage cells lining the pin sites that help fight infection.  Applying lotions or ointments can keep the wounds moist and can cause them to breakdown and open up as well. This can increase the risk for infection. When in doubt call the office.  Surgical incisions should be dressed daily.  If any drainage is noted, use one layer of adaptic or Mepitel, then gauze, Kerlix, and an ace wrap. - These dressing supplies should be available at local medical supply stores [facility], [facility], etc) as well as Insurance claims handler ([facility], [facility], [place], etc)  Once the incision is completely dry and without drainage, it may be left open to air out.  Showering may begin 36-48 hours later.  Cleaning gently with soap and water.p.

## 2023-07-20 ENCOUNTER — Encounter (HOSPITAL_COMMUNITY): Payer: Self-pay | Admitting: Student

## 2023-07-20 ENCOUNTER — Other Ambulatory Visit (HOSPITAL_COMMUNITY): Payer: Self-pay

## 2023-07-20 DIAGNOSIS — E559 Vitamin D deficiency, unspecified: Secondary | ICD-10-CM | POA: Diagnosis present

## 2023-07-20 DIAGNOSIS — F17200 Nicotine dependence, unspecified, uncomplicated: Secondary | ICD-10-CM | POA: Diagnosis present

## 2023-07-20 HISTORY — DX: Nicotine dependence, unspecified, uncomplicated: F17.200

## 2023-07-20 LAB — BASIC METABOLIC PANEL
Anion gap: 8 (ref 5–15)
BUN: 16 mg/dL (ref 6–20)
CO2: 24 mmol/L (ref 22–32)
Calcium: 8 mg/dL — ABNORMAL LOW (ref 8.9–10.3)
Chloride: 104 mmol/L (ref 98–111)
Creatinine, Ser: 1.35 mg/dL — ABNORMAL HIGH (ref 0.61–1.24)
GFR, Estimated: >60 mL/min (ref >60)
Glucose, Bld: 119 mg/dL — ABNORMAL HIGH (ref 70–99)
Potassium: 4.1 mmol/L (ref 3.5–5.1)
Sodium: 136 mmol/L (ref 135–145)

## 2023-07-20 LAB — CBC
HCT: 33.6 % — ABNORMAL LOW (ref 39.0–52.0)
Hemoglobin: 11.6 g/dL — ABNORMAL LOW (ref 13.0–17.0)
MCH: 31.7 pg (ref 26.0–34.0)
MCHC: 34.5 g/dL (ref 30.0–36.0)
MCV: 91.8 fL (ref 80.0–100.0)
Platelets: 287 10*3/uL (ref 150–400)
RBC: 3.66 MIL/uL — ABNORMAL LOW (ref 4.22–5.81)
RDW: 11.8 % (ref 11.5–15.5)
WBC: 13.8 10*3/uL — ABNORMAL HIGH (ref 4.0–10.5)
nRBC: 0 % (ref 0.0–0.2)

## 2023-07-20 LAB — VITAMIN D 25 HYDROXY (VIT D DEFICIENCY, FRACTURES): Vit D, 25-Hydroxy: 10.76 ng/mL — ABNORMAL LOW (ref 30–100)

## 2023-07-20 MED ORDER — APIXABAN 2.5 MG PO TABS
2.5000 mg | ORAL_TABLET | Freq: Two times a day (BID) | ORAL | 0 refills | Status: AC
  Filled 2023-07-20: qty 60, 30d supply, fill #0

## 2023-07-20 MED ORDER — VITAMIN D 25 MCG (1000 UNIT) PO TABS
2000.0000 [IU] | ORAL_TABLET | Freq: Two times a day (BID) | ORAL | Status: DC
Start: 2023-07-20 — End: 2023-07-21
  Administered 2023-07-20 – 2023-07-21 (×3): 2000 [IU] via ORAL
  Filled 2023-07-20 (×3): qty 2

## 2023-07-20 MED ORDER — ALBUTEROL SULFATE (2.5 MG/3ML) 0.083% IN NEBU: 2.5000 mg | INHALATION_SOLUTION | Freq: Four times a day (QID) | RESPIRATORY_TRACT | Status: DC | PRN
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
## 2023-07-20 NOTE — Progress Notes (Signed)
-   s/p ORIF of R acetabular    07/20/23 0900  TOC Brief Assessment  Insurance and Status Reviewed  Patient has primary care physician No  Home environment has been reviewed From home alone.  Prior level of function: PTA independent with ADL's, no DME usage  Prior/Current Home Services No current home services  Social Drivers of Health Review SDOH reviewed no interventions necessary  Readmission risk has been reviewed No  Transition of care needs transition of care needs identified, TOC will continue to follow   Referral made with [employer] for RW. Equipment will be delivered prior to d/c.  Pt without RX med concerns . Pt without transportation issues. Post hospital f/u noted on AVS. Gae Gallop RN,BSN,CM
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
## 2023-07-20 NOTE — Plan of Care (Signed)
  Problem: Clinical Measurements: Goal: Diagnostic test results will improve Outcome: Progressing   Problem: Activity: Goal: Risk for activity intolerance will decrease Outcome: Progressing   Problem: Nutrition: Goal: Adequate nutrition will be maintained Outcome: Progressing   Problem: Coping: Goal: Level of anxiety will decrease Outcome: Progressing   

## 2023-07-21 LAB — BASIC METABOLIC PANEL
Anion gap: 8 (ref 5–15)
BUN: 15 mg/dL (ref 6–20)
CO2: 25 mmol/L (ref 22–32)
Calcium: 8.2 mg/dL — ABNORMAL LOW (ref 8.9–10.3)
Chloride: 104 mmol/L (ref 98–111)
Creatinine, Ser: 1.36 mg/dL — ABNORMAL HIGH (ref 0.61–1.24)
GFR, Estimated: >60 mL/min (ref >60)
Glucose, Bld: 105 mg/dL — ABNORMAL HIGH (ref 70–99)
Potassium: 4.2 mmol/L (ref 3.5–5.1)
Sodium: 137 mmol/L (ref 135–145)

## 2023-07-21 LAB — CBC
HCT: 32.3 % — ABNORMAL LOW (ref 39.0–52.0)
Hemoglobin: 11.3 g/dL — ABNORMAL LOW (ref 13.0–17.0)
MCH: 32.2 pg (ref 26.0–34.0)
MCHC: 35 g/dL (ref 30.0–36.0)
MCV: 92 fL (ref 80.0–100.0)
Platelets: 310 10*3/uL (ref 150–400)
RBC: 3.51 MIL/uL — ABNORMAL LOW (ref 4.22–5.81)
RDW: 12.1 % (ref 11.5–15.5)
WBC: 12.4 10*3/uL — ABNORMAL HIGH (ref 4.0–10.5)
nRBC: 0 % (ref 0.0–0.2)

## 2023-07-21 MED ORDER — DOCUSATE SODIUM 100 MG PO CAPS: 100.0000 mg | ORAL_CAPSULE | Freq: Every day | ORAL | Status: AC | PRN

## 2023-07-21 MED ORDER — ACETAMINOPHEN 500 MG PO TABS: 1000.0000 mg | ORAL_TABLET | Freq: Four times a day (QID) | ORAL | Status: AC | PRN

## 2023-07-21 MED ORDER — GABAPENTIN 300 MG PO CAPS: 300.0000 mg | ORAL_CAPSULE | Freq: Three times a day (TID) | ORAL | 0 refills | Status: AC

## 2023-07-21 MED ORDER — OXYCODONE HCL 10 MG PO TABS: 10.0000 mg | ORAL_TABLET | Freq: Four times a day (QID) | ORAL | 0 refills | Status: AC | PRN

## 2023-07-21 MED ORDER — VITAMIN D3 25 MCG PO TABS: 2000.0000 [IU] | ORAL_TABLET | Freq: Two times a day (BID) | ORAL | Status: AC

## 2023-07-21 NOTE — Plan of Care (Signed)
  Problem: Health Behavior/Discharge Planning: Goal: Ability to manage health-related needs will improve Outcome: Completed/Met   Problem: Clinical Measurements: Goal: Will remain free from infection Outcome: Completed/Met Goal: Diagnostic test results will improve Outcome: Completed/Met Goal: Respiratory complications will improve Outcome: Completed/Met Goal: Cardiovascular complication will be avoided Outcome: Completed/Met   Problem: Coping: Goal: Level of anxiety will decrease Outcome: Completed/Met   Problem: Elimination: Goal: Will not experience complications related to bowel motility Outcome: Completed/Met Goal: Will not experience complications related to urinary retention Outcome: Completed/Met   Problem: Pain Management: Goal: General experience of comfort will improve Outcome: Completed/Met   Problem: Safety: Goal: Ability to remain free from injury will improve Outcome: Completed/Met   Problem: Skin Integrity: Goal: Risk for impaired skin integrity will decrease Outcome: Completed/Met

## 2023-07-21 NOTE — Discharge Summary (Signed)
Physician Discharge Summary  Patient ID: Jordan Ewing MRN: [id number] DOB/AGE: 10/29/1970 52 y.o.  Admit date: 07/17/2023 Discharge date: 07/21/2023  Discharge Diagnoses Patient Active Problem List   Diagnosis Date Noted   Vitamin D deficiency 07/20/2023   Nicotine dependence 07/20/2023   Closed fracture dislocation of right hip joint (HCC) 07/17/2023   Prediabetes 03/04/2023   Hyperlipidemia 03/04/2023    Consultants ***  Procedures ***  HPI: ***  Hospital Course: ***    Allergies as of 07/21/2023   No Known Allergies      Medication List     TAKE these medications    acetaminophen 500 MG tablet Commonly known as: TYLENOL Take 2 tablets (1,000 mg total) by mouth every 6 (six) hours as needed for mild pain (pain score 1-3).   albuterol 108 (90 Base) MCG/ACT inhaler Commonly known as: VENTOLIN HFA Inhale 1-2 puffs into the lungs every 6 (six) hours as needed for wheezing or shortness of breath.   docusate sodium 100 MG capsule Commonly known as: COLACE Take 1 capsule (100 mg total) by mouth daily as needed for mild constipation.   Eliquis 2.5 MG Tabs tablet Generic drug: apixaban Take 1 tablet (2.5 mg total) by mouth 2 (two) times daily.   gabapentin 300 MG capsule Commonly known as: NEURONTIN Take 1 capsule (300 mg total) by mouth 3 (three) times daily.   Oxycodone HCl 10 MG Tabs Take 1-1.5 tablets (10-15 mg total) by mouth every 6 (six) hours as needed for severe pain (pain score 7-10) or moderate pain (pain score 4-6).   vitamin D3 25 MCG tablet Commonly known as: CHOLECALCIFEROL Take 2 tablets (2,000 Units total) by mouth 2 (two) times daily.               Durable Medical Equipment  (From admission, onward)           Start     Ordered   07/21/23 0853  For home use only DME Walker rolling  Once       Question Answer Comment  Walker: With 5 Inch Wheels   Patient needs a walker to treat with the following condition Right  acetabular fracture (HCC)      07/21/23 0853   07/20/23 1413  For home use only DME Walker rolling  Once       Question Answer Comment  Walker: With 5 Inch Wheels   Patient needs a walker to treat with the following condition Gait instability      07/20/23 1413              Follow-up Information     Haddix, Gillie Manners, MD. Schedule an appointment as soon as possible for a visit in 2 week(s).   Specialty: Orthopedic Surgery Why: for wound check and repeat x-rays Contact information: [address] [place] [address] [phone]         Rema Fendt, NP Follow up.   Specialty: Nurse Practitioner Contact information: [address] [place] [address] [phone]                 Signed: Dorena Dew [facility] 07/21/2023, 9:07 AM Please see Amion for pager number during day hours 7:00am-4:30pm

## 2023-07-21 NOTE — Plan of Care (Signed)

## 2023-07-21 NOTE — Progress Notes (Signed)
Orthopaedic Trauma Service (OTS)  2 Days Post-Op Procedure(s) (LRB): OPEN REDUCTION INTERNAL FIXATION ACETABULUM FRACTURE POSTERIOR (Right)  Subjective: Patient reports pain as  moderate; overdid it with PT yesterday but still feel good and would like to go home today .    Objective: Current Vitals Blood pressure 134/82, pulse 87, temperature 98.1 F (36.7 C), resp. rate 19, height 5\' 9"  (1.753 m), weight 95.3 kg, SpO2 95%. Vital signs in last 24 hours: Temp:  [98 F (36.7 C)-98.4 F (36.9 C)] 98.1 F (36.7 C) (12/25 0735) Pulse Rate:  [84-93] 87 (12/25 0735) Resp:  [16-19] 19 (12/25 0735) BP: (130-143)/(70-84) 134/82 (12/25 0735) SpO2:  [95 %-100 %] 95 % (12/25 0735)  Intake/Output from previous day: 12/24 0701 - 12/25 0700 In: 244 [P.O.:240; I.V.:4] Out: -   LABS Recent Labs    07/19/23 0535 07/20/23 0536 07/21/23 0520  HGB 13.5 11.6* 11.3*   Recent Labs    07/20/23 0536 07/21/23 0520  WBC 13.8* 12.4*  RBC 3.66* 3.51*  HCT 33.6* 32.3*  PLT 287 310   Recent Labs    07/20/23 0536 07/21/23 0520  NA 136 137  K 4.1 4.2  CL 104 104  CO2 24 25  BUN 16 15  CREATININE 1.35* 1.36*  GLUCOSE 119* 105*  CALCIUM 8.0* 8.2*   No results for input(s): "LABPT", "INR" in the last 72 hours.   Physical Exam RLE  Dressing intact, clean, dry  Edema/ swelling controlled  Sens: DPN, SPN, TN intact  Motor: EHL, FHL, and lessor toe ext and flex all intact grossly  Brisk cap refill, warm to touch  Assessment/Plan: 2 Days Post-Op Procedure(s) (LRB): OPEN REDUCTION INTERNAL FIXATION ACETABULUM FRACTURE POSTERIOR (Right) 1. PT/OT  2. DVT proph Lovenox now and Eliquis at discharge 3. F/u 8-14 days  Patient already has meds for discharge  Myrene Galas, MD [facility], [facility], [place] [phone]

## 2023-07-22 ENCOUNTER — Telehealth: Payer: Self-pay

## 2023-07-22 NOTE — Transitions of Care (Post Inpatient/ED Visit) (Signed)
07/22/2023  Name: Jordan Ewing MRN: [id number] DOB: 1970-09-05  Today's TOC FU Call Status: Today's TOC FU Call Status:: Successful TOC FU Call Completed TOC FU Call Complete Date: 07/22/23 Patient's Name and Date of Birth confirmed.  Transition Care Management Follow-up Telephone Call Date of Discharge: 07/21/23 Discharge Facility: [facility]) Type of Discharge: Inpatient Admission Primary Inpatient Discharge Diagnosis:: closed fracture right hip How have you been since you were released from the hospital?: Same Any questions or concerns?: Yes Patient Questions/Concerns:: He is concerned about receiving PT to help him progress with ambulation. We reviewed the instructions for TDWB RLE Patient Questions/Concerns Addressed: Other: (I explained to him that he needs to all the orthopedic surgeon for PT orders. I also explained that he needs to schedule a follow up appointment with the surgeon. I provided him with the surgeon's phone number and he said he will call.)  Items Reviewed: Did you receive and understand the discharge instructions provided?: Yes Medications obtained,verified, and reconciled?: Yes (Medications Reviewed) (He has all medications except the tylenol, colace and Vitamin D. I explained that they are OTC. He is complaining of constipation and I urged him to get the colace as well as increase his fluid intake. he siad he didn't have any questions about the meds.) Any new allergies since your discharge?: No Dietary orders reviewed?: Yes Type of Diet Ordered:: heart healthy, low sodium Do you have support at home?: Yes People in Home: significant other Name of Support/Comfort Primary Source: his fiance  Medications Reviewed Today: Medications Reviewed Today     Reviewed by Robyne Peers, RN (Case Manager) on 07/22/23 at 1244  Med List Status: <None>   Medication Order Taking? Sig Documenting Provider Last Dose Status Informant  acetaminophen (TYLENOL) 500 MG  tablet [id number]  Take 2 tablets (1,000 mg total) by mouth every 6 (six) hours as needed for mild pain (pain score 1-3). Juliet Rude, PA-C  Active   albuterol (VENTOLIN HFA) 108 (90 Base) MCG/ACT inhaler [id number] No Inhale 1-2 puffs into the lungs every 6 (six) hours as needed for wheezing or shortness of breath. Ellsworth Lennox, PA-C Past Month Active Spouse/Significant Other, Pharmacy Records  apixaban (ELIQUIS) 2.5 MG TABS tablet [id number]  Take 1 tablet (2.5 mg total) by mouth 2 (two) times daily. Montez Morita, PA-C  Active   cholecalciferol (CHOLECALCIFEROL) 25 MCG tablet [id number]  Take 2 tablets (2,000 Units total) by mouth 2 (two) times daily. Juliet Rude, PA-C  Active   docusate sodium (COLACE) 100 MG capsule [id number]  Take 1 capsule (100 mg total) by mouth daily as needed for mild constipation. Juliet Rude, PA-C  Active   gabapentin (NEURONTIN) 300 MG capsule [id number]  Take 1 capsule (300 mg total) by mouth 3 (three) times daily. Dorena Dew  Active   oxyCODONE 10 MG TABS [id number]  Take 1-1.5 tablets (10-15 mg total) by mouth every 6 (six) hours as needed for severe pain (pain score 7-10) or moderate pain (pain score 4-6). Juliet Rude, PA-C  Active             Home Care and Equipment/Supplies: Were Home Health Services Ordered?: No Any new equipment or medical supplies ordered?: Yes Name of Medical supply agency?: He received the RW from the hospital Were you able to get the equipment/medical supplies?: Yes Do you have any questions related to the use of the equipment/supplies?: No  Functional Questionnaire: Do you need assistance with bathing/showering or dressing?: Yes (  His fiance assists as needed) Do you need assistance with meal preparation?: Yes (his fiance assists as needed) Do you need assistance with eating?: No Do you have difficulty maintaining continence: No Do you need assistance with getting out of bed/getting out of a chair/moving?:  Yes (uses RW with ambulation.) Do you have difficulty managing or taking your medications?: No  Follow up appointments reviewed: PCP Follow-up appointment confirmed?: Yes Date of PCP follow-up appointment?: 08/11/23 Follow-up Provider: Ricky Stabs, NP Specialist Hospital Follow-up appointment confirmed?: No Reason Specialist Follow-Up Not Confirmed: Patient has Specialist Provider Number and will Call for Appointment (I urged him to call orthopedic surgeon today and to inquire about a referral for PT.) Do you need transportation to your follow-up appointment?: No (He said he will be able to get a ride) Do you understand care options if your condition(s) worsen?: Yes-patient verbalized understanding    SIGNATURE Robyne Peers, RN

## 2023-07-22 NOTE — Anesthesia Postprocedure Evaluation (Signed)
Anesthesia Post Note  Patient: Xzavien C Bohan  Procedure(s) Performed: OPEN REDUCTION INTERNAL FIXATION ACETABULUM FRACTURE POSTERIOR (Right: Hip)     Patient location during evaluation: PACU Anesthesia Type: General Level of consciousness: awake and alert Pain management: pain level controlled Vital Signs Assessment: post-procedure vital signs reviewed and stable Respiratory status: spontaneous breathing, nonlabored ventilation, respiratory function stable and patient connected to nasal cannula oxygen Cardiovascular status: blood pressure returned to baseline and stable Postop Assessment: no apparent nausea or vomiting Anesthetic complications: no  No notable events documented.  Last Vitals:  Vitals:   07/21/23 0550 07/21/23 0735  BP: 130/74 134/82  Pulse: 89 87  Resp: 16 19  Temp: 36.7 C 36.7 C  SpO2: 99% 95%    Last Pain:  Vitals:   07/21/23 0935  TempSrc:   PainSc: 0-No pain   Pain Goal: Patients Stated Pain Goal: 0 (07/21/23 0800)                 Juelz Claar L Myson Levi

## 2023-08-11 ENCOUNTER — Encounter: Payer: Self-pay | Admitting: Family

## 2023-08-11 ENCOUNTER — Telehealth: Payer: Self-pay | Admitting: Family

## 2023-08-11 NOTE — Progress Notes (Signed)
 Erroneous encounter-disregard

## 2023-08-11 NOTE — Telephone Encounter (Signed)
 Called pt to reschedule missed HFU; pt stated he is unable to move from bed due to hip fracture. Pt stated he has medication needed for aftercare and that he has an appt scheduled with provider who gave him surgery on 02/04. Pt stated he does not need HFU with provider at the moment and will call for a follow up when needed.

## 2023-09-15 ENCOUNTER — Inpatient Hospital Stay: Payer: Self-pay | Admitting: Family

## 2023-09-24 ENCOUNTER — Ambulatory Visit (INDEPENDENT_AMBULATORY_CARE_PROVIDER_SITE_OTHER): Payer: Self-pay | Admitting: Family

## 2023-09-24 ENCOUNTER — Encounter: Payer: Self-pay | Admitting: Family

## 2023-09-24 VITALS — BP 131/84 | HR 97 | Temp 98.9°F | Ht 67.0 in | Wt 218.8 lb

## 2023-09-24 DIAGNOSIS — S32401S Unspecified fracture of right acetabulum, sequela: Secondary | ICD-10-CM

## 2023-09-24 DIAGNOSIS — N179 Acute kidney failure, unspecified: Secondary | ICD-10-CM

## 2023-09-24 DIAGNOSIS — Z8679 Personal history of other diseases of the circulatory system: Secondary | ICD-10-CM

## 2023-09-24 DIAGNOSIS — Z09 Encounter for follow-up examination after completed treatment for conditions other than malignant neoplasm: Secondary | ICD-10-CM

## 2023-09-24 DIAGNOSIS — Z789 Other specified health status: Secondary | ICD-10-CM

## 2023-09-24 DIAGNOSIS — S73014S Posterior dislocation of right hip, sequela: Secondary | ICD-10-CM
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
## 2023-09-24 NOTE — Progress Notes (Signed)
 Patient states sharps pain in right hip still.

## 2023-12-28 ENCOUNTER — Encounter: Payer: Self-pay | Admitting: Family

## 2023-12-28 NOTE — Progress Notes (Signed)
 Erroneous encounter-disregard

## 2024-02-02 ENCOUNTER — Encounter: Payer: Self-pay | Admitting: Family

## 2024-02-02 NOTE — Progress Notes (Signed)
 Erroneous encounter-disregard

## 2024-04-14 ENCOUNTER — Encounter: Payer: Self-pay | Admitting: Family

## 2024-04-17 ENCOUNTER — Encounter: Payer: Self-pay | Admitting: Family

## 2024-04-26 ENCOUNTER — Encounter: Payer: Self-pay | Admitting: Family

## 2024-04-26 NOTE — Progress Notes (Signed)
 Erroneous encounter-disregard
